# Patient Record
Sex: Male | Born: 1960 | Race: White | Marital: Married | Smoking: Former smoker
Health system: Southern US, Community
[De-identification: ages and names within clinical notes are randomized; demographics above are authoritative.]

## PROBLEM LIST (undated history)

## (undated) DIAGNOSIS — I10 Essential (primary) hypertension: Secondary | ICD-10-CM

## (undated) DIAGNOSIS — I2510 Atherosclerotic heart disease of native coronary artery without angina pectoris: Secondary | ICD-10-CM

## (undated) DIAGNOSIS — E119 Type 2 diabetes mellitus without complications: Secondary | ICD-10-CM

## (undated) DIAGNOSIS — E785 Hyperlipidemia, unspecified: Secondary | ICD-10-CM

## (undated) DIAGNOSIS — Z8249 Family history of ischemic heart disease and other diseases of the circulatory system: Secondary | ICD-10-CM

## (undated) DIAGNOSIS — Z95820 Peripheral vascular angioplasty status with implants and grafts: Secondary | ICD-10-CM

## (undated) HISTORY — PX: CERVICAL FUSION: SHX112

## (undated) HISTORY — DX: Family history of ischemic heart disease and other diseases of the circulatory system: Z82.49

---

## 2014-05-09 ENCOUNTER — Encounter: Payer: PRIVATE HEALTH INSURANCE | Attending: Family Medicine | Admitting: *Deleted

## 2014-05-09 DIAGNOSIS — E119 Type 2 diabetes mellitus without complications: Secondary | ICD-10-CM | POA: Diagnosis not present

## 2014-05-09 DIAGNOSIS — Z713 Dietary counseling and surveillance: Secondary | ICD-10-CM | POA: Diagnosis present

## 2014-05-09 NOTE — Patient Instructions (Addendum)
Plan:  Aim for 3 Carb Choices per meal (45 grams) +/- 1 either way  Aim for 0-15 Carbs per snack if hungry  Include protein in moderation with your meals and snacks Consider reading food labels for Total Carbohydrate and Fat Grams of foods Consider  increasing your activity level by walking for 30 minutes daily as tolerated Continue checking BG at alternate times per day as directed by MD  Continue taking medication  as directed by MD  Make a nice plate for a meal and do not go back for seconds Adriana Simas only for the number of people eating the meal Add beans or chick peas to salad Consider [facility] [employer]

## 2014-05-09 NOTE — Progress Notes (Signed)
Appt start time: 1630 end time:  1800.  Assessment:  Patient was seen on  05/09/14 for individual diabetes education.[name] [name] was diagnosed with T2DM approximately 25 years ago. He has not received any formal education. [name] [name] and his wife have recently moved from [place] to [place]. He has recently established wit new PCP. Wife has MS and is not working.  Issues: portion control and evening snacking. Has eliminated sugar snacks. Wife does most of the shopping and cooking.  Current HbA1c: 7.5%,      FBS 120-130mg /dl  Preferred Learning Style:   No preference indicated   Learning Readiness:   Not ready  Contemplating  Ready  Change in progress  MEDICATIONS: See List, Metformin, Lantus  DIETARY INTAKE: does not use artificial sweeteners  24-hr recall:  B ( AM):  Instant plain oatmeal , coffee, powder creamer/no sweetener Snk ( AM): none  L ( PM): sandwich, tuna/coldcuts,  Diet Soda, Subway Sub 6", Snk ( PM): none D ( PM): Chicken broiled or grilled,fish,  pasta/orzo  ([place] traditional food), vegetables Snk ( PM): nuts from the container Beverages: Diet soda, coffee, water, unsweet tea  Usual physical activity: None   Intervention:  Nutrition counseling provided.  Discussed diabetes disease process and treatment options.  Discussed physiology of diabetes and role of obesity on insulin resistance.  Encouraged moderate weight reduction to improve glucose levels.  Discussed role of medications and diet in glucose control  Provided education on macronutrients on glucose levels.  Provided education on carb counting, importance of regularly scheduled meals/snacks, and meal planning  Discussed effects of physical activity on glucose levels and long-term glucose control.  Recommended 150 minutes of physical activity/week.  Reviewed patient medications.  Discussed role of medication on blood glucose and possible side effects  Discussed blood glucose monitoring and interpretation.   Discussed recommended target ranges and individual ranges.    Described short-term complications: hyper- and hypo-glycemia.  Discussed causes,symptoms, and treatment options.  Discussed prevention, detection, and treatment of long-term complications.  Discussed the role of prolonged elevated glucose levels on body systems.  Discussed role of stress on blood glucose levels and discussed strategies to manage psychosocial issues.  Discussed recommendations for long-term diabetes self-care.  Established checklist for medical, dental, and emotional self-care.  Plan:  Aim for 3 Carb Choices per meal (45 grams) +/- 1 either way  Aim for 0-15 Carbs per snack if hungry  Include protein in moderation with your meals and snacks Consider reading food labels for Total Carbohydrate and Fat Grams of foods Consider  increasing your activity level by walking for 30 minutes daily as tolerated Continue checking BG at alternate times per day as directed by MD  Continue taking medication  as directed by MD  Make a nice plate for a meal and do not go back for seconds Cook only for the number of people eating the meal Add beans or chick peas to salad Consider [facility] Protein Bars  Teaching Method Utilized:  Scientific laboratory technician Hands on  Handouts given during visit include: Carb Counting and Food Label handouts Meal Plan Card My Plate Snack sheet  Barriers to learning/adherence to lifestyle change: committment  Diabetes self-care support plan:   [facility] support group  Wife  Demonstrated degree of understanding via:  Teach Back   Monitoring/Evaluation:  Dietary intake, exercise, test glucose, and body weight , f/u  prn.

## 2014-11-23 ENCOUNTER — Inpatient Hospital Stay (HOSPITAL_COMMUNITY)
Admission: EM | Admit: 2014-11-23 | Discharge: 2014-11-27 | DRG: 247 | Disposition: A | Discharge status: Home / Self Care | Payer: PRIVATE HEALTH INSURANCE | Attending: Cardiovascular Disease | Admitting: Cardiovascular Disease

## 2014-11-23 ENCOUNTER — Encounter (HOSPITAL_COMMUNITY): Payer: Self-pay

## 2014-11-23 ENCOUNTER — Emergency Department (HOSPITAL_COMMUNITY): Payer: PRIVATE HEALTH INSURANCE

## 2014-11-23 DIAGNOSIS — Z87891 Personal history of nicotine dependence: Secondary | ICD-10-CM | POA: Diagnosis not present

## 2014-11-23 DIAGNOSIS — E785 Hyperlipidemia, unspecified: Secondary | ICD-10-CM | POA: Diagnosis present

## 2014-11-23 DIAGNOSIS — E663 Overweight: Secondary | ICD-10-CM | POA: Diagnosis present

## 2014-11-23 DIAGNOSIS — E119 Type 2 diabetes mellitus without complications: Secondary | ICD-10-CM | POA: Diagnosis present

## 2014-11-23 DIAGNOSIS — I10 Essential (primary) hypertension: Secondary | ICD-10-CM | POA: Diagnosis present

## 2014-11-23 DIAGNOSIS — R079 Chest pain, unspecified: Secondary | ICD-10-CM | POA: Insufficient documentation

## 2014-11-23 DIAGNOSIS — M4322 Fusion of spine, cervical region: Secondary | ICD-10-CM | POA: Diagnosis present

## 2014-11-23 DIAGNOSIS — I2510 Atherosclerotic heart disease of native coronary artery without angina pectoris: Secondary | ICD-10-CM | POA: Diagnosis present

## 2014-11-23 DIAGNOSIS — Z79899 Other long term (current) drug therapy: Secondary | ICD-10-CM | POA: Diagnosis not present

## 2014-11-23 DIAGNOSIS — I209 Angina pectoris, unspecified: Secondary | ICD-10-CM

## 2014-11-23 DIAGNOSIS — I214 Non-ST elevation (NSTEMI) myocardial infarction: Principal | ICD-10-CM | POA: Diagnosis present

## 2014-11-23 DIAGNOSIS — Z794 Long term (current) use of insulin: Secondary | ICD-10-CM | POA: Diagnosis not present

## 2014-11-23 DIAGNOSIS — Z9861 Coronary angioplasty status: Secondary | ICD-10-CM

## 2014-11-23 DIAGNOSIS — I252 Old myocardial infarction: Secondary | ICD-10-CM | POA: Diagnosis present

## 2014-11-23 HISTORY — DX: Hyperlipidemia, unspecified: E78.5

## 2014-11-23 HISTORY — DX: Atherosclerotic heart disease of native coronary artery without angina pectoris: I25.10

## 2014-11-23 HISTORY — DX: Essential (primary) hypertension: I10

## 2014-11-23 HISTORY — DX: Peripheral vascular angioplasty status with implants and grafts: Z95.820

## 2014-11-23 HISTORY — DX: Type 2 diabetes mellitus without complications: E11.9

## 2014-11-23 LAB — CBC
HCT: 40.5 % (ref 39.0–52.0)
Hemoglobin: 14.5 g/dL (ref 13.0–17.0)
MCH: 30.7 pg (ref 26.0–34.0)
MCHC: 35.8 g/dL (ref 30.0–36.0)
MCV: 85.6 fL (ref 78.0–100.0)
PLATELETS: 174 10*3/uL (ref 150–400)
RBC: 4.73 MIL/uL (ref 4.22–5.81)
RDW: 12.8 % (ref 11.5–15.5)
WBC: 12 10*3/uL — ABNORMAL HIGH (ref 4.0–10.5)

## 2014-11-23 LAB — PROTIME-INR
INR: 1.11 (ref 0.00–1.49)
PROTHROMBIN TIME: 14.4 s (ref 11.6–15.2)

## 2014-11-23 LAB — COMPREHENSIVE METABOLIC PANEL
ALBUMIN: 3.9 g/dL (ref 3.5–5.2)
ALT: 26 U/L (ref 0–53)
ALT: 25 U/L (ref 0–53)
AST: 41 U/L — AB (ref 0–37)
AST: 35 U/L (ref 0–37)
Albumin: 3.8 g/dL (ref 3.5–5.2)
Alkaline Phosphatase: 78 U/L (ref 39–117)
Alkaline Phosphatase: 78 U/L (ref 39–117)
Anion gap: 14 (ref 5–15)
Anion gap: 14 (ref 5–15)
BILIRUBIN TOTAL: 0.4 mg/dL (ref 0.3–1.2)
BUN: 11 mg/dL (ref 6–23)
BUN: 11 mg/dL (ref 6–23)
CHLORIDE: 100 meq/L (ref 96–112)
CO2: 24 mEq/L (ref 19–32)
CO2: 25 meq/L (ref 19–32)
Calcium: 9.4 mg/dL (ref 8.4–10.5)
Calcium: 9.2 mg/dL (ref 8.4–10.5)
Chloride: 101 mEq/L (ref 96–112)
Creatinine, Ser: 0.58 mg/dL (ref 0.50–1.35)
Creatinine, Ser: 0.64 mg/dL (ref 0.50–1.35)
GFR calc Af Amer: >90 mL/min (ref >90)
GFR calc Af Amer: >90 mL/min (ref >90)
GFR calc non Af Amer: >90 mL/min (ref >90)
Glucose, Bld: 120 mg/dL — ABNORMAL HIGH (ref 70–99)
Glucose, Bld: 110 mg/dL — ABNORMAL HIGH (ref 70–99)
Potassium: 3.9 mEq/L (ref 3.7–5.3)
Potassium: 3.5 mEq/L — ABNORMAL LOW (ref 3.7–5.3)
SODIUM: 139 meq/L (ref 137–147)
Sodium: 139 mEq/L (ref 137–147)
Total Bilirubin: 0.4 mg/dL (ref 0.3–1.2)
Total Protein: 7 g/dL (ref 6.0–8.3)
Total Protein: 6.9 g/dL (ref 6.0–8.3)

## 2014-11-23 LAB — I-STAT CHEM 8, ED
BUN: 11 mg/dL (ref 6–23)
CREATININE: 0.7 mg/dL (ref 0.50–1.35)
Calcium, Ion: 1.16 mmol/L (ref 1.12–1.23)
Chloride: 103 mEq/L (ref 96–112)
Glucose, Bld: 128 mg/dL — ABNORMAL HIGH (ref 70–99)
HCT: 42 % (ref 39.0–52.0)
Hemoglobin: 14.3 g/dL (ref 13.0–17.0)
Potassium: 3.6 mEq/L — ABNORMAL LOW (ref 3.7–5.3)
Sodium: 138 mEq/L (ref 137–147)
TCO2: 24 mmol/L (ref 0–100)

## 2014-11-23 LAB — MAGNESIUM
Magnesium: 1.9 mg/dL (ref 1.5–2.5)
Magnesium: 1.9 mg/dL (ref 1.5–2.5)

## 2014-11-23 LAB — GLUCOSE, CAPILLARY: GLUCOSE-CAPILLARY: 106 mg/dL — AB (ref 70–99)

## 2014-11-23 LAB — TROPONIN I
Troponin I: 6.36 ng/mL (ref <0.30)
Troponin I: 10.56 ng/mL (ref <0.30)

## 2014-11-23 LAB — PRO B NATRIURETIC PEPTIDE: Pro B Natriuretic peptide (BNP): 391.4 pg/mL — ABNORMAL HIGH (ref 0–125)

## 2014-11-23 LAB — APTT: aPTT: 29 seconds (ref 24–37)

## 2014-11-23 LAB — MRSA PCR SCREENING: MRSA by PCR: NEGATIVE

## 2014-11-23 LAB — TSH: TSH: 2.4 u[IU]/mL (ref 0.350–4.500)

## 2014-11-23 LAB — I-STAT TROPONIN, ED: TROPONIN I, POC: 4.18 ng/mL — AB (ref 0.00–0.08)

## 2014-11-23 MED ORDER — ACETAMINOPHEN 325 MG PO TABS
650.0000 mg | ORAL_TABLET | ORAL | Status: DC | PRN
  Administered 2014-11-23: 650 mg via ORAL
  Filled 2014-11-23: qty 2

## 2014-11-23 MED ORDER — NITROGLYCERIN 2 % TD OINT
1.0000 [in_us] | TOPICAL_OINTMENT | Freq: Once | TRANSDERMAL | Status: AC
  Administered 2014-11-23: 1 [in_us] via TOPICAL
  Filled 2014-11-23: qty 1

## 2014-11-23 MED ORDER — ONDANSETRON HCL 4 MG/2ML IJ SOLN: 4.0000 mg | Freq: Four times a day (QID) | INTRAMUSCULAR | Status: DC | PRN

## 2014-11-23 MED ORDER — INSULIN ASPART 100 UNIT/ML ~~LOC~~ SOLN
0.0000 [IU] | Freq: Three times a day (TID) | SUBCUTANEOUS | Status: DC
  Administered 2014-11-24: 3 [IU] via SUBCUTANEOUS
  Administered 2014-11-24: 1 [IU] via SUBCUTANEOUS
  Administered 2014-11-25 – 2014-11-26 (×4): 2 [IU] via SUBCUTANEOUS
  Administered 2014-11-26: 18:00:00 1 [IU] via SUBCUTANEOUS
  Administered 2014-11-27: 2 [IU] via SUBCUTANEOUS

## 2014-11-23 MED ORDER — ATORVASTATIN CALCIUM 80 MG PO TABS
80.0000 mg | ORAL_TABLET | Freq: Every day | ORAL | Status: DC
  Administered 2014-11-24 – 2014-11-26 (×3): 80 mg via ORAL
  Filled 2014-11-23 (×4): qty 1

## 2014-11-23 MED ORDER — INSULIN GLARGINE 100 UNIT/ML ~~LOC~~ SOLN: 45.0000 [IU] | Freq: Every morning | SUBCUTANEOUS | Status: DC

## 2014-11-23 MED ORDER — METOPROLOL SUCCINATE ER 25 MG PO TB24: 25.0000 mg | ORAL_TABLET | Freq: Every day | ORAL | Status: DC

## 2014-11-23 MED ORDER — METOPROLOL TARTRATE 25 MG PO TABS
25.0000 mg | ORAL_TABLET | Freq: Two times a day (BID) | ORAL | Status: DC
  Administered 2014-11-23 – 2014-11-27 (×8): 25 mg via ORAL
  Filled 2014-11-23 (×11): qty 1

## 2014-11-23 MED ORDER — NITROGLYCERIN IN D5W 200-5 MCG/ML-% IV SOLN
10.0000 ug/min | Freq: Once | INTRAVENOUS | Status: AC
  Administered 2014-11-23: 10 ug/min via INTRAVENOUS
  Filled 2014-11-23: qty 250

## 2014-11-23 MED ORDER — ASPIRIN 81 MG PO CHEW: 324.0000 mg | CHEWABLE_TABLET | ORAL | Status: AC

## 2014-11-23 MED ORDER — NITROGLYCERIN 0.4 MG SL SUBL: 0.4000 mg | SUBLINGUAL_TABLET | SUBLINGUAL | Status: DC | PRN

## 2014-11-23 MED ORDER — PRAVASTATIN SODIUM 20 MG PO TABS: 20.0000 mg | ORAL_TABLET | Freq: Every day | ORAL | Status: DC

## 2014-11-23 MED ORDER — IRBESARTAN 150 MG PO TABS: 150.0000 mg | ORAL_TABLET | Freq: Every day | ORAL | Status: DC

## 2014-11-23 MED ORDER — PIOGLITAZONE HCL 45 MG PO TABS: 45.0000 mg | ORAL_TABLET | Freq: Every day | ORAL | Status: DC

## 2014-11-23 MED ORDER — PANTOPRAZOLE SODIUM 40 MG PO TBEC
40.0000 mg | DELAYED_RELEASE_TABLET | Freq: Every day | ORAL | Status: DC
  Administered 2014-11-23 – 2014-11-27 (×5): 40 mg via ORAL
  Filled 2014-11-23 (×5): qty 1

## 2014-11-23 MED ORDER — ASPIRIN 300 MG RE SUPP: 300.0000 mg | RECTAL | Status: AC

## 2014-11-23 MED ORDER — MORPHINE SULFATE 2 MG/ML IJ SOLN
2.0000 mg | Freq: Once | INTRAMUSCULAR | Status: AC
  Administered 2014-11-23: 2 mg via INTRAVENOUS
  Filled 2014-11-23: qty 1

## 2014-11-23 MED ORDER — HEPARIN (PORCINE) IN NACL 100-0.45 UNIT/ML-% IJ SOLN
1500.0000 [IU]/h | INTRAMUSCULAR | Status: DC
  Administered 2014-11-23: 1400 [IU]/h via INTRAVENOUS
  Administered 2014-11-24 – 2014-11-25 (×2): 1500 [IU]/h via INTRAVENOUS
  Filled 2014-11-23 (×9): qty 250

## 2014-11-23 MED ORDER — HEPARIN SODIUM (PORCINE) 5000 UNIT/ML IJ SOLN: 60.0000 [IU]/kg | Freq: Once | INTRAMUSCULAR | Status: DC

## 2014-11-23 MED ORDER — HEPARIN BOLUS VIA INFUSION
4000.0000 [IU] | Freq: Once | INTRAVENOUS | Status: AC
  Administered 2014-11-23: 4000 [IU] via INTRAVENOUS
  Filled 2014-11-23: qty 4000

## 2014-11-23 MED ORDER — NITROGLYCERIN IN D5W 200-5 MCG/ML-% IV SOLN
3.0000 ug/min | INTRAVENOUS | Status: DC
  Administered 2014-11-23: 10 ug/min via INTRAVENOUS

## 2014-11-23 MED ORDER — ASPIRIN EC 81 MG PO TBEC
81.0000 mg | DELAYED_RELEASE_TABLET | Freq: Every day | ORAL | Status: DC
  Administered 2014-11-24 – 2014-11-25 (×2): 81 mg via ORAL
  Filled 2014-11-23 (×2): qty 1

## 2014-11-23 NOTE — ED Notes (Signed)
Per [facility]: pt. Began to gradually develop chest pain last evening after eating dinner. Sharp, shooting pain to substernal area. Pt. Denies SOB, N/V, radiation. Pt. States that sitting up helped with CP. Pt. Was prescribed a BB 12/2. Pt. Given 324 ASA, 2 Nitro with pain improving to 0/10.

## 2014-11-23 NOTE — ED Notes (Signed)
i-Stat trop. result given to Dr. Radford Pax

## 2014-11-23 NOTE — H&P (Signed)
HPI: Mr. Nellessen is a 53 year old moderately overweight. Caucasian male father of one son who goes to [employer] who works in [employer]. His primary care physician is Dr. Silvestre Moment [facility]. He has a history of hypertension, diabetes, hyperlipidemia. He also has a strong family history of heart disease with father that had an MI at age 80 and a brother who died at age 51 with a history of coronary artery disease. He's had chest pain over the last several weeks and was seen by his primary care physician on 11/14/14 with PPIs. At that time his EKG showed no acute changes. He had fairly constant chest pain most the night last night and came to the emergency room today. His troponin was greater than 6 and his EKG shows inferolateral T-wave inversion. He is being admitted for a non-STEMI. He is being already been treated with aspirin and is on IV heparin. He has nitro paste topically. He continues to have 1/10 chest pain.  Problem List: There are no active problems to display for this patient.   PMHx:  Past Medical History  Diagnosis Date  . Diabetes mellitus without complication     type 2  . Hypertension   . Hyperlipidemia    Past Surgical History  Procedure Laterality Date  . Cervical fusion      FAMHx: No family history on file.  SOCHx:  reports that he has quit smoking. He does not have any smokeless tobacco history on file. He reports that he drinks alcohol. He reports that he does not use illicit drugs.  ALLERGIES: No Known Allergies  ROS: Pertinent items are noted in HPI.  HOME MEDICATIONS:  (Not in a hospital admission)  HOSPITAL MEDICATIONS: I have reviewed the patient's current medications.  VITALS: Blood pressure 137/81, pulse 87, temperature 98.5 F (36.9 C), temperature source Oral, resp. rate 21, SpO2 97 %.  INPUT/OUTPUT        PHYSICAL EXAM: General appearance: alert and no distress Neck: no adenopathy, no carotid bruit,  no JVD, supple, symmetrical, trachea midline and thyroid not enlarged, symmetric, no tenderness/mass/nodules Lungs: clear to auscultation bilaterally Heart: regular rate and rhythm, S1, S2 normal, no murmur, click, rub or gallop Extremities: extremities normal, atraumatic, no cyanosis or edema and 2+ pedal pulses  LABS:  BMP  Recent Labs  11/23/14 1706 11/23/14 1731  NA 138 139  K 3.6* 3.9  CL 103 101  CO2  --  24  GLUCOSE 128* 120*  BUN 11 11  CREATININE 0.70 0.58  CALCIUM  --  9.4  GFRNONAA  --  >90  GFRAA  --  >90    CBC  Recent Labs Lab 11/23/14 1731  WBC 12.0*  RBC 4.73  HGB 14.5  HCT 40.5  PLT 174  MCV 85.6    HEMOGLOBIN A1C No results found for: HGBA1C, MPG  Cardiac Panel (last 3 results)  Recent Labs  11/23/14 1731  TROPONINI 6.36*    BNP (last 3 results) No results for input(s): PROBNP in the last 8760 hours.  TSH No results for input(s): TSH in the last 8760 hours.  CHOLESTEROL No results for input(s): CHOL in the last 8760 hours.  Hepatic Function Panel  Recent Labs  11/23/14 1731  PROT 7.0  ALBUMIN 3.9  AST 41*  ALT 26  ALKPHOS 78  BILITOT 0.4    IMAGING: Dg Chest Portable 1 View  11/23/2014   CLINICAL DATA:  One-day history of chest  pain.  EXAM: PORTABLE CHEST - 1 VIEW  COMPARISON:  None.  FINDINGS: The cardiac silhouette, mediastinal and hilar contours are within normal limits given the AP projection and portable technique. The lungs are clear. No pleural effusion. The bony thorax is intact.  IMPRESSION: No acute cardiopulmonary findings.   Electronically Signed   By: Loralie ChampagneMark  Gallerani M.D.   On: 11/23/2014 16:53    IMPRESSION: 1. Non-STEMI-the patient has low-grade residual chest pain. His troponins are positive and he is on IV heparin. He has inferolateral T-wave inversion on his 12-lead EKG. 2. Hypertension: Controlled on current medications 3. Diabetes: On every morning insulin and oral hypoglycemics 4. Hyperlipidemia:  On statin therapy   RECOMMENDATION: 1. Admit  to stepdown. Start IV nitroglycerin as well as beta blocker. Continue IV heparin. Cycle enzymes. The patient will need coronary angiography on Monday unless he becomes unstable over the weekend 2. 2-D echocardiogram for LV function 3. Cycle enzymes  Time Spent Directly with Patient: 30 minutes  BERRY,JONATHAN J 11/23/2014, 6:49 PM

## 2014-11-23 NOTE — ED Provider Notes (Signed)
CSN: [id number]     Arrival date & time 11/23/14  1601 History   First MD Initiated Contact with Patient 11/23/14 1618     Chief Complaint  Patient presents with  . Chest Pain      HPI Per [facility]: pt. Began to gradually develop chest pain last evening after eating dinner. Sharp, shooting pain to substernal area. Pt. Denies SOB, N/V, radiation. Pt. States that sitting up helped with CP. Pt. Was prescribed a BB 12/2. Pt. Given 324 ASA, 2 Nitro with pain improving to 0/10. Past Medical History  Diagnosis Date  . Diabetes mellitus without complication     type 2  . Hypertension   . Hyperlipidemia    Past Surgical History  Procedure Laterality Date  . Cervical fusion     No family history on file. History  Substance Use Topics  . Smoking status: Former Games developer  . Smokeless tobacco: Not on file  . Alcohol Use: Yes     Comment: occasional    Review of Systems  All other systems reviewed and are negative  Allergies  Review of patient's allergies indicates no known allergies.  Home Medications   Prior to Admission medications   Medication Sig Start Date End Date Taking? Authorizing Provider  esomeprazole (NEXIUM) 20 MG capsule Take 20 mg by mouth 2 (two) times daily.    Historical Provider, MD  insulin glargine (LANTUS) 100 UNIT/ML injection Inject 45 Units into the skin every morning.    Historical Provider, MD  metFORMIN (GLUCOPHAGE) 1000 MG tablet Take 1,000 mg by mouth 2 (two) times daily with a meal.    Historical Provider, MD  metoprolol succinate (TOPROL-XL) 25 MG 24 hr tablet Take 25 mg by mouth daily.    Historical Provider, MD  pioglitazone (ACTOS) 45 MG tablet Take 45 mg by mouth daily.    Historical Provider, MD  pravastatin (PRAVACHOL) 20 MG tablet Take 20 mg by mouth daily.    Historical Provider, MD  valsartan (DIOVAN) 160 MG tablet Take 160 mg by mouth daily.    Historical Provider, MD   BP 148/86 mmHg  Pulse 88  Temp(Src) 98.2 F (36.8 C) (Oral)   Resp 15  Ht 6' (1.829 m)  Wt 264 lb 12.4 oz (120.1 kg)  BMI 35.90 kg/m2  SpO2 96% Physical Exam Physical Exam  Nursing note and vitals reviewed. Constitutional: He is oriented to person, place, and time. He appears well-developed and well-nourished. No distress.  HENT:  Head: Normocephalic and atraumatic.  Eyes: Pupils are equal, round, and reactive to light.  Neck: Normal range of motion.  Cardiovascular: Normal rate and intact distal pulses.   Pulmonary/Chest: No respiratory distress.  Abdominal: Normal appearance. He exhibits no distension.  Musculoskeletal: Normal range of motion.  Neurological: He is alert and oriented to person, place, and time. No cranial nerve deficit.  Skin: Skin is warm and dry. No rash noted.  Psychiatric: He has a normal mood and affect. His behavior is normal.   ED Course  Procedures (including critical care time) Medications  heparin ADULT infusion 100 units/mL (25000 units/250 mL) (1,500 Units/hr Intravenous New Bag/Given 11/24/14 0800)  pantoprazole (PROTONIX) EC tablet 40 mg (40 mg Oral Given 11/24/14 1035)  aspirin chewable tablet 324 mg (324 mg Oral Not Given 11/23/14 1915)    Or  aspirin suppository 300 mg ( Rectal See Alternative 11/23/14 1915)  aspirin EC tablet 81 mg (81 mg Oral Given 11/24/14 1035)  nitroGLYCERIN (NITROSTAT) SL tablet 0.4 mg (  not administered)  acetaminophen (TYLENOL) tablet 650 mg (650 mg Oral Given 11/23/14 2044)  ondansetron (ZOFRAN) injection 4 mg (not administered)  nitroGLYCERIN 50 mg in dextrose 5 % 250 mL (0.2 mg/mL) infusion (10 mcg/min Intravenous New Bag/Given 11/23/14 2037)  metoprolol tartrate (LOPRESSOR) tablet 25 mg (25 mg Oral Given 11/24/14 1035)  atorvastatin (LIPITOR) tablet 80 mg (not administered)  insulin aspart (novoLOG) injection 0-9 Units (0 Units Subcutaneous Not Given 11/24/14 0800)  nitroGLYCERIN (NITROGLYN) 2 % ointment 1 inch (1 inch Topical Given 11/23/14 1735)  morphine 2 MG/ML injection 2  mg (2 mg Intravenous Given 11/23/14 1737)  heparin bolus via infusion 4,000 Units (0 Units Intravenous Stopped 11/23/14 1836)  nitroGLYCERIN 50 mg in dextrose 5 % 250 mL (0.2 mg/mL) infusion (10 mcg/min Intravenous New Bag/Given 11/23/14 1852)    CRITICAL CARE Performed by: Nelva NayBEATON,Makayia Duplessis L Total critical care time: 30 min Critical care time was exclusive of separately billable procedures and treating other patients. Critical care was necessary to treat or prevent imminent or life-threatening deterioration. Critical care was time spent personally by me on the following activities: development of treatment plan with patient and/or surrogate as well as nursing, discussions with consultants, evaluation of patient's response to treatment, examination of patient, obtaining history from patient or surrogate, ordering and performing treatments and interventions, ordering and review of laboratory studies, ordering and review of radiographic studies, pulse oximetry and re-evaluation of patient's condition.  Labs Review Labs Reviewed  CBC - Abnormal; Notable for the following:    WBC 12.0 (*)    All other components within normal limits  COMPREHENSIVE METABOLIC PANEL - Abnormal; Notable for the following:    Glucose, Bld 120 (*)    AST 41 (*)    All other components within normal limits  TROPONIN I - Abnormal; Notable for the following:    Troponin I 6.36 (*)    All other components within normal limits  HEPARIN LEVEL (UNFRACTIONATED) - Abnormal; Notable for the following:    Heparin Unfractionated 0.29 (*)    All other components within normal limits  CBC - Abnormal; Notable for the following:    WBC 10.9 (*)    All other components within normal limits  COMPREHENSIVE METABOLIC PANEL - Abnormal; Notable for the following:    Potassium 3.5 (*)    Glucose, Bld 110 (*)    All other components within normal limits  BASIC METABOLIC PANEL - Abnormal; Notable for the following:    Glucose, Bld 113  (*)    All other components within normal limits  LIPID PANEL - Abnormal; Notable for the following:    HDL 34 (*)    All other components within normal limits  PRO B NATRIURETIC PEPTIDE - Abnormal; Notable for the following:    Pro B Natriuretic peptide (BNP) 391.4 (*)    All other components within normal limits  TROPONIN I - Abnormal; Notable for the following:    Troponin I 10.56 (*)    All other components within normal limits  TROPONIN I - Abnormal; Notable for the following:    Troponin I 5.92 (*)    All other components within normal limits  TROPONIN I - Abnormal; Notable for the following:    Troponin I 7.99 (*)    All other components within normal limits  GLUCOSE, CAPILLARY - Abnormal; Notable for the following:    Glucose-Capillary 106 (*)    All other components within normal limits  GLUCOSE, CAPILLARY - Abnormal; Notable for the following:  Glucose-Capillary 105 (*)    All other components within normal limits  I-STAT TROPOININ, ED - Abnormal; Notable for the following:    Troponin i, poc 4.18 (*)    All other components within normal limits  I-STAT CHEM 8, ED - Abnormal; Notable for the following:    Potassium 3.6 (*)    Glucose, Bld 128 (*)    All other components within normal limits  MRSA PCR SCREENING  APTT  MAGNESIUM  PROTIME-INR  MAGNESIUM  TSH  HEPARIN LEVEL (UNFRACTIONATED)  T4, FREE  HEMOGLOBIN A1C    Imaging Review Dg Chest Portable 1 View  11/23/2014   CLINICAL DATA:  One-day history of chest pain.  EXAM: PORTABLE CHEST - 1 VIEW  COMPARISON:  None.  FINDINGS: The cardiac silhouette, mediastinal and hilar contours are within normal limits given the AP projection and portable technique. The lungs are clear. No pleural effusion. The bony thorax is intact.  IMPRESSION: No acute cardiopulmonary findings.   Electronically Signed   By: Loralie Champagne M.D.   On: 11/23/2014 16:53     EKG Interpretation   Date/Time:  Friday November 23 2014  16:08:10 EST Ventricular Rate:  78 PR Interval:  140 QRS Duration: 92 QT Interval:  378 QTC Calculation: 430 R Axis:   32 Text Interpretation:  Sinus rhythm Abnormal T, consider ischemia, diffuse  leads Abnormal ekg Confirmed by Alois Colgan  MD, Damyen Knoll ([id number]) on 11/23/2014  4:16:52 PM     Cardiology conatcted and came and admitted to their service. MDM   Final diagnoses:  Chest pain  NSTEMI (non-ST elevated myocardial infarction)        Nelia Shi, MD 11/24/14 1123

## 2014-11-23 NOTE — Progress Notes (Signed)
ANTICOAGULATION CONSULT NOTE - Initial Consult  Pharmacy Consult for heparin Indication: chest pain/ACS  No Known Allergies  Patient Measurements: weight 120 kg, height 72 inches (per patient)    Heparin Dosing Weight: 104 kg  Vital Signs: Temp: 98.5 F (36.9 C) (12/11 1612) Temp Source: Oral (12/11 1612) BP: 130/78 mmHg (12/11 1630) Pulse Rate: 75 (12/11 1630)  Labs:  Recent Labs  11/23/14 1706  HGB 14.3  HCT 42.0  CREATININE 0.70    CrCl cannot be calculated (Unknown ideal weight.).   Medical History: Past Medical History  Diagnosis Date  . Diabetes mellitus without complication     type 2  . Hypertension   . Hyperlipidemia     Medications:  See med rec   Assessment: Patient is a 53 y.o M presented to the ED with c/o CP and found to have positive troponin.  To start heparin for ACS.  He denies hx of recent bleeding events or on anticoagulants PTA.   Goal of Therapy:  Heparin level 0.3-0.7 units/ml Monitor platelets by anticoagulation protocol: Yes   Plan:  1) heparin 4000 units IV bolus x1, then 1400 units/hr 2) check 6 hor heparin level  [name] [name] P 11/23/2014,5:36 PM

## 2014-11-23 NOTE — ED Notes (Signed)
Dr Berry at bedside.   

## 2014-11-24 DIAGNOSIS — I517 Cardiomegaly: Secondary | ICD-10-CM

## 2014-11-24 LAB — CBC
HCT: 39.3 % (ref 39.0–52.0)
Hemoglobin: 13.4 g/dL (ref 13.0–17.0)
MCH: 29.5 pg (ref 26.0–34.0)
MCHC: 34.1 g/dL (ref 30.0–36.0)
MCV: 86.4 fL (ref 78.0–100.0)
Platelets: 184 10*3/uL (ref 150–400)
RBC: 4.55 MIL/uL (ref 4.22–5.81)
RDW: 12.9 % (ref 11.5–15.5)
WBC: 10.9 10*3/uL — ABNORMAL HIGH (ref 4.0–10.5)

## 2014-11-24 LAB — TROPONIN I
TROPONIN I: 4.27 ng/mL — AB (ref <0.30)
Troponin I: 5.92 ng/mL (ref <0.30)
Troponin I: 7.99 ng/mL (ref <0.30)

## 2014-11-24 LAB — BASIC METABOLIC PANEL
Anion gap: 14 (ref 5–15)
BUN: 11 mg/dL (ref 6–23)
CHLORIDE: 103 meq/L (ref 96–112)
CO2: 23 mEq/L (ref 19–32)
Calcium: 9.2 mg/dL (ref 8.4–10.5)
Creatinine, Ser: 0.64 mg/dL (ref 0.50–1.35)
GFR calc Af Amer: >90 mL/min (ref >90)
GFR calc non Af Amer: >90 mL/min (ref >90)
GLUCOSE: 113 mg/dL — AB (ref 70–99)
POTASSIUM: 3.8 meq/L (ref 3.7–5.3)
Sodium: 140 mEq/L (ref 137–147)

## 2014-11-24 LAB — HEPARIN LEVEL (UNFRACTIONATED)
HEPARIN UNFRACTIONATED: 0.29 [IU]/mL — AB (ref 0.30–0.70)
HEPARIN UNFRACTIONATED: 0.47 [IU]/mL (ref 0.30–0.70)
Heparin Unfractionated: 0.38 IU/mL (ref 0.30–0.70)

## 2014-11-24 LAB — HEMOGLOBIN A1C
Hgb A1c MFr Bld: 8.3 % — ABNORMAL HIGH (ref <5.7)
MEAN PLASMA GLUCOSE: 192 mg/dL — AB (ref <117)

## 2014-11-24 LAB — GLUCOSE, CAPILLARY
GLUCOSE-CAPILLARY: 105 mg/dL — AB (ref 70–99)
GLUCOSE-CAPILLARY: 203 mg/dL — AB (ref 70–99)
Glucose-Capillary: 130 mg/dL — ABNORMAL HIGH (ref 70–99)
Glucose-Capillary: 164 mg/dL — ABNORMAL HIGH (ref 70–99)

## 2014-11-24 LAB — LIPID PANEL
CHOLESTEROL: 137 mg/dL (ref 0–200)
HDL: 34 mg/dL — ABNORMAL LOW (ref >39)
LDL CALC: 86 mg/dL (ref 0–99)
TRIGLYCERIDES: 84 mg/dL (ref <150)
Total CHOL/HDL Ratio: 4 RATIO
VLDL: 17 mg/dL (ref 0–40)

## 2014-11-24 LAB — T4, FREE: Free T4: 1.22 ng/dL (ref 0.80–1.80)

## 2014-11-24 NOTE — Progress Notes (Signed)
ANTICOAGULATION CONSULT NOTE - Follow Up Consult  Pharmacy Consult for Heparin  Indication: chest pain/ACS  No Known Allergies  Labs:  Recent Labs  11/23/14 1706  11/23/14 1731 11/23/14 2141  11/24/14 0116 11/24/14 0640 11/24/14 0930 11/24/14 1530 11/24/14 1905  HGB 14.3  --  14.5  --   --  13.4  --   --   --   --   HCT 42.0  --  40.5  --   --  39.3  --   --   --   --   PLT  --   --  174  --   --  184  --   --   --   --   APTT  --   --  29  --   --   --   --   --   --   --   LABPROT  --   --  14.4  --   --   --   --   --   --   --   INR  --   --  1.11  --   --   --   --   --   --   --   HEPARINUNFRC  --   --   --   --   < > 0.29*  --  0.38 >2.20* 0.47  CREATININE 0.70  --  0.58 0.64  --  0.64  --   --   --   --   TROPONINI  --   < > 6.36* 10.56*  --  5.92* 7.99*  --  4.27*  --   < > = values in this interval not displayed.  Estimated Creatinine Clearance: 144.5 mL/min (by C-G formula based on Cr of 0.64).  Assessment: Heparin for +troponin/ACS, awaiting cath on Monday.  Heparin level therapeutic x 2   Goal of Therapy:  Heparin level 0.3-0.7 units/ml Monitor platelets by anticoagulation protocol: Yes   Plan:  1. Continue heparin drip at 1500 units/hr 3. Daily CBC/HL 3. Monitor for bleeding and any changes to cath plans  Thank you. Okey Regal, PharmD [phone]  11/24/2014 8:45 PM

## 2014-11-24 NOTE — Progress Notes (Signed)
ANTICOAGULATION CONSULT NOTE - Follow Up Consult  Pharmacy Consult for Heparin  Indication: chest pain/ACS  No Known Allergies  Patient Measurements: Height: 6' (182.9 cm) Weight: 266 lb 1.5 oz (120.7 kg) IBW/kg (Calculated) : 77.6  Vital Signs: Temp: 98 F (36.7 C) (12/12 0000) Temp Source: Oral (12/12 0000) BP: 118/75 mmHg (12/12 0000) Pulse Rate: 71 (12/12 0000)  Labs:  Recent Labs  11/23/14 1706 11/23/14 1731 11/23/14 2141 11/24/14 0116  HGB 14.3 14.5  --  13.4  HCT 42.0 40.5  --  39.3  PLT  --  174  --  184  APTT  --  29  --   --   LABPROT  --  14.4  --   --   INR  --  1.11  --   --   HEPARINUNFRC  --   --   --  0.29*  CREATININE 0.70 0.58 0.64 0.64  TROPONINI  --  6.36* 10.56*  --     Estimated Creatinine Clearance: 144.8 mL/min (by C-G formula based on Cr of 0.64).  Assessment: Heparin for +troponin. First HL is slightly low at 0.29. Other labs as above. No issues per RN.   Goal of Therapy:  Heparin level 0.3-0.7 units/ml Monitor platelets by anticoagulation protocol: Yes   Plan:  -Increase heparin drip to 1500 units/hr -1000 HL -Daily CBC/HL -Monitor for bleeding  Abran Duke 11/24/2014,2:26 AM

## 2014-11-24 NOTE — Progress Notes (Signed)
ANTICOAGULATION CONSULT NOTE - Follow Up Consult  Pharmacy Consult for Heparin  Indication: chest pain/ACS  No Known Allergies  Patient Measurements: Height: 6' (182.9 cm) Weight: 264 lb 12.4 oz (120.1 kg) IBW/kg (Calculated) : 77.6  Heparin dosing weight: 104kg  Vital Signs: Temp: 98 F (36.7 C) (12/12 1214) Temp Source: Oral (12/12 1214) BP: 132/75 mmHg (12/12 1214) Pulse Rate: 88 (12/12 1035)  Labs:  Recent Labs  11/23/14 1706  11/23/14 1731 11/23/14 2141 11/24/14 0116 11/24/14 0640 11/24/14 0930  HGB 14.3  --  14.5  --  13.4  --   --   HCT 42.0  --  40.5  --  39.3  --   --   PLT  --   --  174  --  184  --   --   APTT  --   --  29  --   --   --   --   LABPROT  --   --  14.4  --   --   --   --   INR  --   --  1.11  --   --   --   --   HEPARINUNFRC  --   --   --   --  0.29*  --  0.38  CREATININE 0.70  --  0.58 0.64 0.64  --   --   TROPONINI  --   < > 6.36* 10.56* 5.92* 7.99*  --   < > = values in this interval not displayed.  Estimated Creatinine Clearance: 144.5 mL/min (by C-G formula based on Cr of 0.64).  Assessment: Heparin for +troponin/ACS, awaiting cath on Monday. First HL this morning was slightly low at 0.29 and rate was increased to 1500 units/hr. Now heparin level is therapeutic. Hgb and plts remain stable and no bleeding noted.  Goal of Therapy:  Heparin level 0.3-0.7 units/ml Monitor platelets by anticoagulation protocol: Yes   Plan:  1. Continue heparin drip at 1500 units/hr 2. Confirmatory level at 1600 3. Daily CBC/HL 4. Monitor for bleeding and any changes to cath plans  Susanna Benge D. Glendene Wyer, PharmD, BCPS Clinical Pharmacist Pager: [phone] 11/24/2014 1:41 PM

## 2014-11-24 NOTE — Progress Notes (Signed)
CARDIAC REHAB PHASE I   PRE:  Rate/Rhythm: 87  BP:  Sitting: 141/70     SaO2: 98% RA  MODE:  Ambulation: 700 ft   POST:  Rate/Rhythm: 89  BP:  Sitting: 148/86     SaO2: 97% RA  10:03-10:37pm  Patient tolerated walk well.  He walked independently holding onto the IV pole.  Patient did not complain of shortness of breath or any chest pain. He states that he is interested in Cardiac Rehab at [facility] Patient placed in chair with call bell in reach.    Bland Span, MS 11/24/2014 10:35 AM

## 2014-11-24 NOTE — Progress Notes (Signed)
[facility]  2D Echocardiogram completed.  Arijana Narayan L Mahogany Torrance, RDCS 11/24/2014 9:24 AM

## 2014-11-24 NOTE — Progress Notes (Signed)
Subjective:  Pt presented to ED w/ chest pain over the last several weeks and was seen by his primary care physician on 11/14/14 with PPIs. At that time his EKG showed no acute changes. He had fairly constant chest pain most the night last night and came to the emergency room on 11/24/14. His troponin was greater than 6 and his EKG shows inferolateral T-wave inversion.   Admitted for NSTEMI  Tx initially w/ heparin gtt, ASA, and nitro paste.    Denies CP, SOB, or exertional angina this AM.  Slightly hungry, awaiting cath on Monday.   Problem List: Patient Active Problem List   Diagnosis Date Noted  . Non-STEMI (non-ST elevated myocardial infarction) 11/23/2014  . Insulin dependent diabetes mellitus 11/23/2014  . Hypertension 11/23/2014  . Hyperlipidemia 11/23/2014  . NSTEMI (non-ST elevated myocardial infarction) 11/23/2014  . Chest pain     PMHx:  Past Medical History  Diagnosis Date  . Diabetes mellitus without complication     type 2  . Hypertension   . Hyperlipidemia    Past Surgical History  Procedure Laterality Date  . Cervical fusion      FAMHx: No family history on file.  SOCHx:  reports that he has quit smoking. He does not have any smokeless tobacco history on file. He reports that he drinks alcohol. He reports that he does not use illicit drugs.  ALLERGIES: No Known Allergies  ROS: Pertinent items are noted in HPI.  HOME MEDICATIONS: Prescriptions prior to admission  Medication Sig Dispense Refill Last Dose  . esomeprazole (NEXIUM) 20 MG capsule Take 20 mg by mouth 2 (two) times daily.   More than a month at Unknown time  . insulin glargine (LANTUS) 100 UNIT/ML injection Inject 45 Units into the skin every morning.   More than a month at Unknown time  . metFORMIN (GLUCOPHAGE) 1000 MG tablet Take 1,000 mg by mouth 2 (two) times daily with a meal.   More than a month at Unknown time  . metoprolol succinate (TOPROL-XL) 25 MG 24 hr tablet Take 25  mg by mouth daily.   More than a month at 1900  . pioglitazone (ACTOS) 45 MG tablet Take 45 mg by mouth daily.   More than a month at Unknown time  . pravastatin (PRAVACHOL) 20 MG tablet Take 20 mg by mouth daily.   More than a month at Unknown time  . valsartan (DIOVAN) 160 MG tablet Take 160 mg by mouth daily.   More than a month at Unknown time    HOSPITAL MEDICATIONS: I have reviewed the patient's current medications.  VITALS: Blood pressure 132/75, pulse 88, temperature 98 F (36.7 C), temperature source Oral, resp. rate 18, height 6' (1.829 m), weight 120.1 kg (264 lb 12.4 oz), SpO2 98 %.  INPUT/OUTPUT I/O last 3 completed shifts: In: 194.4 [I.V.:194.4] Out: 400 [Urine:400] Total I/O In: 276 [P.O.:240; I.V.:36] Out: 975 [Urine:975]    PHYSICAL EXAM: General appearance: alert, cooperative and no distress Neck: no JVD Lungs: clear to auscultation bilaterally Heart: regular rate and rhythm, S1, S2 normal, no murmur, click, rub or gallop Extremities: extremities normal, atraumatic, no cyanosis or edema and 2+ pedal pulses  LABS:  BMP  Recent Labs  11/23/14 1731 11/23/14 2141 11/24/14 0116  NA 139 139 140  K 3.9 3.5* 3.8  CL 101 100 103  CO2 24 25 23   GLUCOSE 120* 110* 113*  BUN 11 11 11   CREATININE 0.58 0.64  0.64  CALCIUM 9.4 9.2 9.2  GFRNONAA >90 >90 >90  GFRAA >90 >90 >90    CBC  Recent Labs Lab 11/24/14 0116  WBC 10.9*  RBC 4.55  HGB 13.4  HCT 39.3  PLT 184  MCV 86.4    HEMOGLOBIN A1C No results found for: HGBA1C, MPG  Cardiac Panel (last 3 results)  Recent Labs  11/23/14 2141 11/24/14 0116 11/24/14 0640  TROPONINI 10.56* 5.92* 7.99*    BNP (last 3 results)  Recent Labs  11/23/14 2141  PROBNP 391.4*    TSH  Recent Labs  11/23/14 2141  TSH 2.400    CHOLESTEROL  Recent Labs  11/24/14 0116  CHOL 137    Hepatic Function Panel  Recent Labs  11/23/14 1731 11/23/14 2141  PROT 7.0 6.9  ALBUMIN 3.9 3.8  AST 41*  35  ALT 26 25  ALKPHOS 78 78  BILITOT 0.4 0.4    IMAGING: Dg Chest Portable 1 View  11/23/2014   CLINICAL DATA:  One-day history of chest pain.  EXAM: PORTABLE CHEST - 1 VIEW  COMPARISON:  None.  FINDINGS: The cardiac silhouette, mediastinal and hilar contours are within normal limits given the AP projection and portable technique. The lungs are clear. No pleural effusion. The bony thorax is intact.  IMPRESSION: No acute cardiopulmonary findings.   Electronically Signed   By: Loralie ChampagneMark  Gallerani M.D.   On: 11/23/2014 16:53    Assessment: 1. NSTEMI, troponin trending down 2. Hypertension, controlled 3. DM II, controlled 4. Hyperlipidemia: On statin therapy    Plan: Pt with NSTEMI, awaiting PCI on Monday.  Troponin trending down at this point, continue to cycle.  Continue with BB, heparin gtt, Lipitor 80 mg, Nitro gtt (if angina, otherwise can wean off).  2D Echo pending to evaluate LV fxn.  Can start ACE after cath. 2D Echo pending.   Gildardo CrankerHess, Bryan 11/24/2014, 12:23 PM   Patient seen and examined with Dr. Paulina FusiHess. We discussed all aspects of the encounter. I agree with the assessment and plan as stated above.   Doing well. No further CP. For cath Monday. Continue current regimen. Can got to tele.   Truman Haywardaniel Jamarien Rodkey,MD 2:37 PM

## 2014-11-25 LAB — GLUCOSE, CAPILLARY
GLUCOSE-CAPILLARY: 155 mg/dL — AB (ref 70–99)
Glucose-Capillary: 158 mg/dL — ABNORMAL HIGH (ref 70–99)
Glucose-Capillary: 163 mg/dL — ABNORMAL HIGH (ref 70–99)
Glucose-Capillary: 188 mg/dL — ABNORMAL HIGH (ref 70–99)

## 2014-11-25 LAB — CBC
HEMATOCRIT: 38.4 % — AB (ref 39.0–52.0)
HEMOGLOBIN: 13 g/dL (ref 13.0–17.0)
MCH: 29.4 pg (ref 26.0–34.0)
MCHC: 33.9 g/dL (ref 30.0–36.0)
MCV: 86.9 fL (ref 78.0–100.0)
Platelets: 167 10*3/uL (ref 150–400)
RBC: 4.42 MIL/uL (ref 4.22–5.81)
RDW: 13 % (ref 11.5–15.5)
WBC: 8.9 10*3/uL (ref 4.0–10.5)

## 2014-11-25 LAB — HEPARIN LEVEL (UNFRACTIONATED): HEPARIN UNFRACTIONATED: 0.44 [IU]/mL (ref 0.30–0.70)

## 2014-11-25 MED ORDER — ASPIRIN 81 MG PO CHEW
81.0000 mg | CHEWABLE_TABLET | ORAL | Status: AC
  Administered 2014-11-26: 81 mg via ORAL
  Filled 2014-11-25: qty 1

## 2014-11-25 MED ORDER — SODIUM CHLORIDE 0.9 % IV SOLN: 250.0000 mL | INTRAVENOUS | Status: DC | PRN

## 2014-11-25 MED ORDER — SODIUM CHLORIDE 0.9 % IJ SOLN
3.0000 mL | Freq: Two times a day (BID) | INTRAMUSCULAR | Status: DC
  Administered 2014-11-25: 3 mL via INTRAVENOUS

## 2014-11-25 MED ORDER — SODIUM CHLORIDE 0.9 % IJ SOLN: 3.0000 mL | INTRAMUSCULAR | Status: DC | PRN

## 2014-11-25 MED ORDER — SODIUM CHLORIDE 0.9 % IV SOLN
INTRAVENOUS | Status: DC
  Administered 2014-11-26: 04:00:00 via INTRAVENOUS

## 2014-11-25 MED ORDER — ASPIRIN EC 81 MG PO TBEC: 81.0000 mg | DELAYED_RELEASE_TABLET | Freq: Every day | ORAL | Status: DC

## 2014-11-25 NOTE — Progress Notes (Signed)
Subjective: No CP, SOB, orthopnea  Objective: Vital signs in last 24 hours: Temp:  [98 F (36.7 C)-98.8 F (37.1 C)] 98.8 F (37.1 C) (12/13 0400) Pulse Rate:  [74-85] 75 (12/13 1013) Resp:  [18-21] 21 (12/13 0400) BP: (118-137)/(64-75) 130/68 mmHg (12/13 1013) SpO2:  [96 %-98 %] 96 % (12/13 0400) Weight:  [266 lb 12.8 oz (121.02 kg)] 266 lb 12.8 oz (121.02 kg) (12/13 0400) Last BM Date: 11/24/14  Intake/Output from previous day: 12/12 0701 - 12/13 0700 In: 1416 [P.O.:1200; I.V.:216] Out: 2390 [Urine:2390] Intake/Output this shift: Total I/O In: -  Out: 500 [Urine:500]  Medications Current Facility-Administered Medications  Medication Dose Route Frequency Provider Last Rate Last Dose  . acetaminophen (TYLENOL) tablet 650 mg  650 mg Oral Q4H PRN Runell GessJonathan J Berry, MD   650 mg at 11/23/14 2044  . aspirin EC tablet 81 mg  81 mg Oral Daily Runell GessJonathan J Berry, MD   81 mg at 11/25/14 1014  . atorvastatin (LIPITOR) tablet 80 mg  80 mg Oral q1800 Runell GessJonathan J Berry, MD   80 mg at 11/24/14 1820  . heparin ADULT infusion 100 units/mL (25000 units/250 mL)  1,500 Units/hr Intravenous Continuous Abran DukeJames Ledford, RPH 15 mL/hr at 11/24/14 0800 1,500 Units/hr at 11/24/14 0800  . insulin aspart (novoLOG) injection 0-9 Units  0-9 Units Subcutaneous TID WC Dwana MelenaBryan W Hager, PA-C   2 Units at 11/25/14 [phone]  . metoprolol tartrate (LOPRESSOR) tablet 25 mg  25 mg Oral BID Runell GessJonathan J Berry, MD   25 mg at 11/25/14 1013  . nitroGLYCERIN (NITROSTAT) SL tablet 0.4 mg  0.4 mg Sublingual Q5 Min x 3 PRN Runell GessJonathan J Berry, MD      . ondansetron [facility](ZOFRAN) injection 4 mg  4 mg Intravenous Q6H PRN Runell GessJonathan J Berry, MD      . pantoprazole (PROTONIX) EC tablet 40 mg  40 mg Oral Daily Runell GessJonathan J Berry, MD   40 mg at 11/25/14 1014    PE: General appearance: alert, cooperative and no distress Lungs: clear to auscultation bilaterally Heart: regular rate and rhythm, S1, S2 normal, no murmur, click, rub or  gallop Extremities: No LEE Pulses: 2+ and symmetric Skin: Warm and dry Neurologic: Grossly normal  Lab Results:   Recent Labs  11/23/14 1731 11/24/14 0116 11/25/14 0431  WBC 12.0* 10.9* 8.9  HGB 14.5 13.4 13.0  HCT 40.5 39.3 38.4*  PLT 174 184 167   BMET  Recent Labs  11/23/14 1731 11/23/14 2141 11/24/14 0116  NA 139 139 140  K 3.9 3.5* 3.8  CL 101 100 103  CO2 24 25 23   GLUCOSE 120* 110* 113*  BUN 11 11 11   CREATININE 0.58 0.64 0.64  CALCIUM 9.4 9.2 9.2   PT/INR  Recent Labs  11/23/14 1731  LABPROT 14.4  INR 1.11   Cholesterol  Recent Labs  11/24/14 0116  CHOL 137   Lipid Panel     Component Value Date/Time   CHOL 137 11/24/2014 0116   TRIG 84 11/24/2014 0116   HDL 34* 11/24/2014 0116   CHOLHDL 4.0 11/24/2014 0116   VLDL 17 11/24/2014 0116   LDLCALC 86 11/24/2014 0116    Assessment/Plan  Principal Problem:   Non-STEMI (non-ST elevated myocardial infarction) Active Problems:   Insulin dependent diabetes mellitus   Hypertension   Hyperlipidemia   NSTEMI (non-ST elevated myocardial infarction)  Plan:  Pain free.  Troponin peaked at 10.56.  On ASA, statin, lopressor 25bid, IV heparin.  Left heart  cath tomorrow.  Echo revealed normal EF and wall motion.    LOS: 2 days    HAGER, BRYAN PA-C 11/25/2014 10:35 AM Patient seen and examined. I agree with the assessment and plan as detailed above. See also my additional thoughts below.   The patient is stable and is ready for intervention tomorrow.  Willa Rough, MD, [facility] 11/25/2014 10:51 AM

## 2014-11-25 NOTE — Progress Notes (Signed)
ANTICOAGULATION CONSULT NOTE - Follow Up Consult  Pharmacy Consult for Heparin  Indication: chest pain/ACS  No Known Allergies  Labs:  Recent Labs  11/23/14 1731 11/23/14 2141 11/24/14 0116 11/24/14 0640  11/24/14 1530 11/24/14 1905 11/25/14 0431  HGB 14.5  --  13.4  --   --   --   --  13.0  HCT 40.5  --  39.3  --   --   --   --  38.4*  PLT 174  --  184  --   --   --   --  167  APTT 29  --   --   --   --   --   --   --   LABPROT 14.4  --   --   --   --   --   --   --   INR 1.11  --   --   --   --   --   --   --   HEPARINUNFRC  --   --  0.29*  --   < > >2.20* 0.47 0.44  CREATININE 0.58 0.64 0.64  --   --   --   --   --   TROPONINI 6.36* 10.56* 5.92* 7.99*  --  4.27*  --   --   < > = values in this interval not displayed.  Estimated Creatinine Clearance: 145.1 mL/min (by C-G formula based on Cr of 0.64).  Assessment: Heparin for +troponin/ACS, awaiting cath on Monday.  Heparin level therapeutic   Goal of Therapy:  Heparin level 0.3-0.7 units/ml Monitor platelets by anticoagulation protocol: Yes   Plan:  1. Continue heparin drip at 1500 units/hr 3. Daily CBC/HL 3. Monitor for bleeding and any changes to cath plans  Thank you. Okey Regal, PharmD [phone]  11/25/2014 1:40 PM

## 2014-11-26 ENCOUNTER — Encounter (HOSPITAL_COMMUNITY)
Admission: EM | Disposition: A | Discharge status: Home / Self Care | Payer: PRIVATE HEALTH INSURANCE | Source: Home / Self Care | Attending: Cardiovascular Disease

## 2014-11-26 ENCOUNTER — Other Ambulatory Visit: Payer: Self-pay

## 2014-11-26 DIAGNOSIS — I2510 Atherosclerotic heart disease of native coronary artery without angina pectoris: Secondary | ICD-10-CM

## 2014-11-26 HISTORY — PX: LEFT HEART CATHETERIZATION WITH CORONARY ANGIOGRAM: SHX5451

## 2014-11-26 LAB — CBC
HCT: 40.1 % (ref 39.0–52.0)
Hemoglobin: 13.5 g/dL (ref 13.0–17.0)
MCH: 29.2 pg (ref 26.0–34.0)
MCHC: 33.7 g/dL (ref 30.0–36.0)
MCV: 86.6 fL (ref 78.0–100.0)
PLATELETS: 160 10*3/uL (ref 150–400)
RBC: 4.63 MIL/uL (ref 4.22–5.81)
RDW: 12.9 % (ref 11.5–15.5)
WBC: 8.1 10*3/uL (ref 4.0–10.5)

## 2014-11-26 LAB — GLUCOSE, CAPILLARY
GLUCOSE-CAPILLARY: 131 mg/dL — AB (ref 70–99)
GLUCOSE-CAPILLARY: 183 mg/dL — AB (ref 70–99)
Glucose-Capillary: 171 mg/dL — ABNORMAL HIGH (ref 70–99)
Glucose-Capillary: 135 mg/dL — ABNORMAL HIGH (ref 70–99)

## 2014-11-26 LAB — HEPARIN LEVEL (UNFRACTIONATED): HEPARIN UNFRACTIONATED: 0.49 [IU]/mL (ref 0.30–0.70)

## 2014-11-26 SURGERY — LEFT HEART CATHETERIZATION WITH CORONARY ANGIOGRAM
Anesthesia: LOCAL

## 2014-11-26 MED ORDER — LIDOCAINE HCL (PF) 1 % IJ SOLN
INTRAMUSCULAR | Status: AC
  Filled 2014-11-26: qty 30

## 2014-11-26 MED ORDER — MORPHINE SULFATE 2 MG/ML IJ SOLN
2.0000 mg | INTRAMUSCULAR | Status: DC | PRN
  Administered 2014-11-26: 2 mg via INTRAVENOUS
  Filled 2014-11-26: qty 1

## 2014-11-26 MED ORDER — SODIUM CHLORIDE 0.9 % IV SOLN
0.2500 mg/kg/h | INTRAVENOUS | Status: AC
  Filled 2014-11-26: qty 250

## 2014-11-26 MED ORDER — VERAPAMIL HCL 2.5 MG/ML IV SOLN
INTRAVENOUS | Status: AC
  Filled 2014-11-26: qty 2

## 2014-11-26 MED ORDER — BIVALIRUDIN 250 MG IV SOLR
INTRAVENOUS | Status: AC
  Filled 2014-11-26: qty 250

## 2014-11-26 MED ORDER — HEPARIN (PORCINE) IN NACL 2-0.9 UNIT/ML-% IJ SOLN
INTRAMUSCULAR | Status: AC
  Filled 2014-11-26: qty 1000

## 2014-11-26 MED ORDER — TICAGRELOR 90 MG PO TABS
90.0000 mg | ORAL_TABLET | Freq: Two times a day (BID) | ORAL | Status: DC
  Administered 2014-11-27: 10:00:00 90 mg via ORAL
  Filled 2014-11-26 (×2): qty 1

## 2014-11-26 MED ORDER — HEPARIN SODIUM (PORCINE) 1000 UNIT/ML IJ SOLN
INTRAMUSCULAR | Status: AC
  Filled 2014-11-26: qty 1

## 2014-11-26 MED ORDER — SODIUM CHLORIDE 0.9 % IV SOLN
1.7500 mg/kg/h | INTRAVENOUS | Status: DC
  Filled 2014-11-26: qty 250

## 2014-11-26 MED ORDER — SODIUM CHLORIDE 0.9 % IV SOLN
INTRAVENOUS | Status: AC
  Administered 2014-11-26: 17:00:00 via INTRAVENOUS

## 2014-11-26 MED ORDER — NITROGLYCERIN 1 MG/10 ML FOR IR/CATH LAB
INTRA_ARTERIAL | Status: AC
  Filled 2014-11-26: qty 10

## 2014-11-26 MED ORDER — HEPARIN (PORCINE) IN NACL 2-0.9 UNIT/ML-% IJ SOLN
INTRAMUSCULAR | Status: AC
  Filled 2014-11-26: qty 500

## 2014-11-26 MED ORDER — TICAGRELOR 90 MG PO TABS
ORAL_TABLET | ORAL | Status: AC
  Filled 2014-11-26: qty 2

## 2014-11-26 MED ORDER — ACETAMINOPHEN 325 MG PO TABS: 650.0000 mg | ORAL_TABLET | ORAL | Status: DC | PRN

## 2014-11-26 MED ORDER — ASPIRIN 81 MG PO CHEW
81.0000 mg | CHEWABLE_TABLET | Freq: Every day | ORAL | Status: DC
  Administered 2014-11-27: 81 mg via ORAL
  Filled 2014-11-26 (×2): qty 1

## 2014-11-26 MED ORDER — ONDANSETRON HCL 4 MG/2ML IJ SOLN: 4.0000 mg | Freq: Four times a day (QID) | INTRAMUSCULAR | Status: DC | PRN

## 2014-11-26 NOTE — Progress Notes (Signed)
ANTICOAGULATION CONSULT NOTE - Follow Up Consult  Pharmacy Consult for Heparin  Indication: NSTEMI  No Known Allergies  Labs:  Recent Labs  11/23/14 1731 11/23/14 2141 11/24/14 0116 11/24/14 0640  11/24/14 1530 11/24/14 1905 11/25/14 0431 11/26/14 0529  HGB 14.5  --  13.4  --   --   --   --  13.0 13.5  HCT 40.5  --  39.3  --   --   --   --  38.4* 40.1  PLT 174  --  184  --   --   --   --  167 160  APTT 29  --   --   --   --   --   --   --   --   LABPROT 14.4  --   --   --   --   --   --   --   --   INR 1.11  --   --   --   --   --   --   --   --   HEPARINUNFRC  --   --  0.29*  --   < > >2.20* 0.47 0.44 0.49  CREATININE 0.58 0.64 0.64  --   --   --   --   --   --   TROPONINI 6.36* 10.56* 5.92* 7.99*  --  4.27*  --   --   --   < > = values in this interval not displayed.  Estimated Creatinine Clearance: 144.8 mL/min (by C-G formula based on Cr of 0.64).  Assessment: 53 yo M on heparin for NSTEMI.  Heparin level is therapeutic on 1500 units/hr.  No change needed.  Follow-up after cath.  Goal of Therapy:  Heparin level 0.3-0.7 units/ml Monitor platelets by anticoagulation protocol: Yes   Plan:  1. Continue heparin drip at 1500 units/hr 3. Daily CBC and heparin level  3. Follow up after cath.  [employer], Pharm.D., BCPS Clinical Pharmacist Pager [phone] 11/26/2014 11:11 AM

## 2014-11-26 NOTE — CV Procedure (Signed)
[name] [name] is a 53 y.o. male    [id number] LOCATION:  FACILITY: [facility]  PHYSICIAN: Quay Burow, M.D. 02/03/1961   DATE OF PROCEDURE:  11/26/2014  DATE OF DISCHARGE:     CARDIAC CATHETERIZATION     History obtained from chart review.[name] [name] is a 53 year old moderately overweight. Caucasian male father of one son who goes to [employer] who works in [employer]. His primary care physician is [name] [facility]. He has a history of hypertension, diabetes, hyperlipidemia. He also has a strong family history of heart disease with father that had an MI at age 19 and a brother who died at age 57 with a history of coronary artery disease. He's had chest pain over the last several weeks and was seen by his primary care physician on 11/14/14 with PPIs. At that time his EKG showed no acute changes. He had fairly constant chest pain most the night last night and came to the emergency room today. His troponin was greater than 6 and his EKG shows inferolateral T-wave inversion. He is being admitted for a non-STEMI. He is being already been treated with aspirin and is on IV heparin. He was admitted over the weekend and his enzymes were cycled. His peak troponin was 10. He's remained pain-free on IV heparin. He presents now for cardiac catheterization.  PROCEDURE DESCRIPTION:   The patient was brought to the second floor [facility] cath lab in the postabsorptive state. He once not premedicated . His right breastwas prepped and shaved in usual sterile fashion. Xylocaine 1% was used for local anesthesia. A 6 French sheath was inserted into the right radial artery using standard Seldinger technique. The patient received 6000 units  of heparin  intravenously.  A 5 [place] TIG catheter and pigtail catheter were used for selective coronary angiography and left ventriculography respectively. Visipaque dye was used for the entirety of the case. Retrograde aortic, left  ventricular and pullback pressures were recorded.    HEMODYNAMICS:    AO SYSTOLIC/AO DIASTOLIC: 765/46   LV SYSTOLIC/LV DIASTOLIC: 503/54  ANGIOGRAPHIC RESULTS:   1. Left main; normal  2. LAD; 99% mid with some apical disease 3. Left circumflex; nondominant and normal. There was a moderate size ramus branch that had a long 60% segmental stenosis in the proximal third.  4. Right coronary artery; dominant and normal 5. Left ventriculography; RAO left ventriculogram was performed using  25 mL of Visipaque dye at 12 mL/second. The overall LVEF estimated  60 %  With wall motion abnormalities notable for a focal inferoapical wall motion variability  IMPRESSION:[name] [name] has a high-grade mid LAD lesion. We will proceed with PCI and stenting using drug-eluting stent, Angiomax and Brilenta.  Procedure description: The patient received Angiomax bolus with an ACT of 442. Total contrast used during the case was 210. Using an XB LAD 3 cm 6 [place] guide catheter along with a 0.14/190 cm long pro-water guidewire and a 2 mm x 12 mm long balloon predilatation was performed. Following this stenting was performed with a 2.5 mm x 18 mm long science drug-eluting stent deployed at 16 atm. 200 g of intracoronary technician was administered. Post dilatation was performed with a 2.75 mm x 15 mm long noncompliant balloon at 16 atm (2.81 mm) resulting in reduction of a 99% stenosis to 0% residual with TIMI-3 flow. The patient tolerated the procedure well. The guidewire and catheter were removed. The sheath was removed and a TR band was placed on the right  wrist to achieve patent hemostasis. The patient left the lab in stable condition.  Final impression: Successful PCI and stenting of high-grade mid LAD lesion and setting of non-STEMI with drug-eluting stent, Angiomax, and dual antiplatelet therapy using Brilenta. Patient will be hydrated overnight, and discharged home in the morning. I will see him back in the  office in 2-3 weeks for follow-up.  Lorretta Harp MD, [facility] 11/26/2014 3:52 PM

## 2014-11-26 NOTE — Progress Notes (Signed)
Patient Name: [name] [name] Date of Encounter: 11/26/2014     Principal Problem:   Non-STEMI (non-ST elevated myocardial infarction) Active Problems:   Insulin dependent diabetes mellitus   Hypertension   Hyperlipidemia   NSTEMI (non-ST elevated myocardial infarction)    SUBJECTIVE No further chest pain. Awaiting cardiac cath today at 12  CURRENT MEDS . [START ON 11/27/2014] aspirin EC  81 mg Oral Daily  . atorvastatin  80 mg Oral q1800  . insulin aspart  0-9 Units Subcutaneous TID WC  . metoprolol tartrate  25 mg Oral BID  . pantoprazole  40 mg Oral Daily  . sodium chloride  3 mL Intravenous Q12H    OBJECTIVE  Filed Vitals:   11/25/14 1013 11/25/14 1435 11/25/14 2050 11/26/14 0525  BP: 130/68 121/71 125/82 132/75  Pulse: 75 70 73 81  Temp:  98.6 F (37 C) 97.9 F (36.6 C) 98.2 F (36.8 C)  TempSrc:  Oral Oral Oral  Resp:  18 20 21   Height:      Weight:    265 lb 9.6 oz (120.475 kg)  SpO2:  98% 98% 98%    Intake/Output Summary (Last 24 hours) at 11/26/14 0741 Last data filed at 11/26/14 0500  Gross per 24 hour  Intake    614 ml  Output   1000 ml  Net   -386 ml   Filed Weights   11/24/14 0314 11/25/14 0400 11/26/14 0525  Weight: 264 lb 12.4 oz (120.1 kg) 266 lb 12.8 oz (121.02 kg) 265 lb 9.6 oz (120.475 kg)    PHYSICAL EXAM  General: Pleasant, NAD. Neuro: Alert and oriented X 3. Moves all extremities spontaneously. Psych: Normal affect. HEENT:  Normal  Neck: Supple without bruits or JVD. Lungs:  Resp regular and unlabored, CTA. Heart: RRR no s3, s4, or murmurs. Abdomen: Soft, non-tender, non-distended, BS + x 4.  Extremities: No clubbing, cyanosis or edema. DP/PT/Radials 2+ and equal bilaterally.  Accessory Clinical Findings  CBC  Recent Labs  11/25/14 0431 11/26/14 0529  WBC 8.9 8.1  HGB 13.0 13.5  HCT 38.4* 40.1  MCV 86.9 86.6  PLT 167 160   Basic Metabolic Panel  Recent Labs  11/23/14 1731 11/23/14 2141  11/24/14 0116  NA 139 139 140  K 3.9 3.5* 3.8  CL 101 100 103  CO2 24 25 23   GLUCOSE 120* 110* 113*  BUN 11 11 11   CREATININE 0.58 0.64 0.64  CALCIUM 9.4 9.2 9.2  MG 1.9 1.9  --    Liver Function Tests  Recent Labs  11/23/14 1731 11/23/14 2141  AST 41* 35  ALT 26 25  ALKPHOS 78 78  BILITOT 0.4 0.4  PROT 7.0 6.9  ALBUMIN 3.9 3.8   No results for input(s): LIPASE, AMYLASE in the last 72 hours. Cardiac Enzymes  Recent Labs  11/24/14 0116 11/24/14 0640 11/24/14 1530  TROPONINI 5.92* 7.99* 4.27*    Hemoglobin A1C  Recent Labs  11/24/14 0116  HGBA1C 8.3*   Fasting Lipid Panel  Recent Labs  11/24/14 0116  CHOL 137  HDL 34*  LDLCALC 86  TRIG 84  CHOLHDL 4.0   Thyroid Function Tests  Recent Labs  11/23/14 2141  TSH 2.400    TELE  NSR  Radiology/Studies  Dg Chest Portable 1 View  11/23/2014   CLINICAL DATA:  One-day history of chest pain.  EXAM: PORTABLE CHEST - 1 VIEW  COMPARISON:  None.  FINDINGS: The cardiac silhouette, mediastinal and hilar contours are within normal  limits given the AP projection and portable technique. The lungs are clear. No pleural effusion. The bony thorax is intact.  IMPRESSION: No acute cardiopulmonary findings.   Electronically Signed   By: Loralie Champagne M.D.   On: 11/23/2014 16:53    2D ECHO Study Date: 11/24/2014 LV EF: 60% Study Conclusions - Left ventricle: The cavity size was normal. Wall thickness was increased in a pattern of mild LVH. The estimated ejection fraction was 60%. Wall motion was normal; there were no regional wall motion abnormalities. - Right ventricle: The cavity size was normal. Systolic function was normal.   ASSESSMENT AND PLAN [name] [name] is a 53 y.o. male with a history of HTN, obesity, DM, HLD and family hx of CAD who was admitted to [facility] on 11/23/14 with chest pain and found to have NSTEMI.   NSTEMI- troponin 7.99--> 4.27 -- 2D ECHO 11/24/14 EF 60%. Mild LVH, no  RWMA. Normal RV.  -- Remains on heparin gtt. Nitro drip discontinued as he is chest pain free now.  -- LHC today. He is NPO -- Continue ASA, statin, lopressor 25mg  BID and heparin gtt.   HTN- well controlled,BB  HLD- continue statin  DM- SSI. Holding metformin with heart cath -- if CBG up, may need SSI.  Byrd Hesselbach R PA-C  Pager [phone]  I have seen and evaluated the patient this AM along with Janee Morn, KATHRYN R PA-C. I have reviewed all of the available data & chart.  I agree with her findings, examination as well as impression recommendations.  Admitted on Friday for NSTEMI - no further angina & Troponin trending down. Plan LHC-PCI today - via R Radial approach.  On statin & BB + ASA &IV Heparin.   Marykay Lex, M.D., M.S. Interventional Cardiologist   Pager # [phone]

## 2014-11-26 NOTE — Interval H&P Note (Signed)
Cath Lab Visit (complete for each Cath Lab visit)  Clinical Evaluation Leading to the Procedure:   ACS: Yes.    Non-ACS:    Anginal Classification: CCS IV  Anti-ischemic medical therapy: Minimal Therapy (1 class of medications)  Non-Invasive Test Results: No non-invasive testing performed  Prior CABG: No previous CABG      History and Physical Interval Note:  11/26/2014 2:20 PM  [name] [name]  has presented today for surgery, with the diagnosis of NSTEMI  The various methods of treatment have been discussed with the patient and family. After consideration of risks, benefits and other options for treatment, the patient has consented to  Procedure(s): LEFT HEART CATHETERIZATION WITH CORONARY ANGIOGRAM (N/A) as a surgical intervention .  The patient's history has been reviewed, patient examined, no change in status, stable for surgery.  I have reviewed the patient's chart and labs.  Questions were answered to the patient's satisfaction.     Runell Gess

## 2014-11-26 NOTE — Care Management Note (Addendum)
  Page 1 of 1   11/27/2014     11:28:52 AM CARE MANAGEMENT NOTE 11/27/2014  Patient:  [name],[name]   Account Number:  [id number]  Date Initiated:  11/26/2014  Documentation initiated by:  AMERSON,JULIE  Subjective/Objective Assessment:   Pt s/p PCI with DES on 12/14.  PTA, pt independent, lives with spouse.     Action/Plan:   Pt discharging on Brilinta therapy.  Free 30 day trial card and copay card given to pt with booklet.  Will check copay coverage.   Anticipated DC Date:  11/27/2014   Anticipated DC Plan:  HOME/SELF CARE      DC Planning Services  CM consult  Medication Assistance      Choice offered to / List presented to:             Status of service:  Completed, signed off Medicare Important Message given?  NO (If response is "NO", the following Medicare IM given date fields will be blank) Date Medicare IM given:   Medicare IM given by:   Date Additional Medicare IM given:   Additional Medicare IM given by:    Discharge Disposition:  HOME/SELF CARE  Per UR Regulation:  Reviewed for med. necessity/level of care/duration of stay  If discussed at Long Length of Stay Meetings, dates discussed:    Comments:  hx/o per [facility]  @ [employer] # [phone] BRILINTA 90 MG BID 30 DAY SUPPLY COVER-YES CO-PAY-$75.00 TIER-3 DRUG PRIOR APPROVAL - YES # [phone] PHARMACY: [facility], [facility] AND [facility] Patient has Brilinta Card for d/c.

## 2014-11-27 ENCOUNTER — Encounter (HOSPITAL_COMMUNITY): Payer: Self-pay | Admitting: Cardiovascular Disease

## 2014-11-27 ENCOUNTER — Telehealth: Payer: Self-pay | Admitting: *Deleted

## 2014-11-27 DIAGNOSIS — I25110 Atherosclerotic heart disease of native coronary artery with unstable angina pectoris: Secondary | ICD-10-CM

## 2014-11-27 DIAGNOSIS — Z9861 Coronary angioplasty status: Secondary | ICD-10-CM

## 2014-11-27 DIAGNOSIS — I2510 Atherosclerotic heart disease of native coronary artery without angina pectoris: Secondary | ICD-10-CM

## 2014-11-27 DIAGNOSIS — Z95820 Peripheral vascular angioplasty status with implants and grafts: Secondary | ICD-10-CM

## 2014-11-27 HISTORY — DX: Atherosclerotic heart disease of native coronary artery without angina pectoris: I25.10

## 2014-11-27 HISTORY — DX: Peripheral vascular angioplasty status with implants and grafts: Z95.820

## 2014-11-27 LAB — CBC
HCT: 38.1 % — ABNORMAL LOW (ref 39.0–52.0)
HEMOGLOBIN: 13.1 g/dL (ref 13.0–17.0)
MCH: 29.6 pg (ref 26.0–34.0)
MCHC: 34.4 g/dL (ref 30.0–36.0)
MCV: 86.2 fL (ref 78.0–100.0)
Platelets: 169 10*3/uL (ref 150–400)
RBC: 4.42 MIL/uL (ref 4.22–5.81)
RDW: 12.8 % (ref 11.5–15.5)
WBC: 8.8 10*3/uL (ref 4.0–10.5)

## 2014-11-27 LAB — BASIC METABOLIC PANEL
Anion gap: 12 (ref 5–15)
BUN: 11 mg/dL (ref 6–23)
CHLORIDE: 103 meq/L (ref 96–112)
CO2: 25 mEq/L (ref 19–32)
Calcium: 9.1 mg/dL (ref 8.4–10.5)
Creatinine, Ser: 0.77 mg/dL (ref 0.50–1.35)
GFR calc non Af Amer: >90 mL/min (ref >90)
Glucose, Bld: 165 mg/dL — ABNORMAL HIGH (ref 70–99)
POTASSIUM: 4.2 meq/L (ref 3.7–5.3)
Sodium: 140 mEq/L (ref 137–147)

## 2014-11-27 LAB — GLUCOSE, CAPILLARY: Glucose-Capillary: 152 mg/dL — ABNORMAL HIGH (ref 70–99)

## 2014-11-27 LAB — POCT ACTIVATED CLOTTING TIME: ACTIVATED CLOTTING TIME: 442 s

## 2014-11-27 MED ORDER — ASPIRIN 81 MG PO CHEW: 81.0000 mg | CHEWABLE_TABLET | Freq: Every day | ORAL | Status: DC

## 2014-11-27 MED ORDER — NITROGLYCERIN 0.4 MG SL SUBL: 0.4000 mg | SUBLINGUAL_TABLET | SUBLINGUAL | Status: DC | PRN

## 2014-11-27 MED ORDER — METFORMIN HCL 1000 MG PO TABS: 1000.0000 mg | ORAL_TABLET | Freq: Two times a day (BID) | ORAL | Status: AC

## 2014-11-27 MED ORDER — ACETAMINOPHEN 325 MG PO TABS: 650.0000 mg | ORAL_TABLET | ORAL | Status: DC | PRN

## 2014-11-27 MED ORDER — TICAGRELOR 90 MG PO TABS: 90.0000 mg | ORAL_TABLET | Freq: Two times a day (BID) | ORAL | Status: DC

## 2014-11-27 MED ORDER — ATORVASTATIN CALCIUM 80 MG PO TABS: 80.0000 mg | ORAL_TABLET | Freq: Every day | ORAL | Status: DC

## 2014-11-27 MED ORDER — METOPROLOL TARTRATE 25 MG PO TABS: 25.0000 mg | ORAL_TABLET | Freq: Two times a day (BID) | ORAL | Status: DC

## 2014-11-27 MED FILL — Sodium Chloride IV Soln 0.9%: INTRAVENOUS | Qty: 50 | Status: AC

## 2014-11-27 NOTE — Discharge Summary (Signed)
Physician Discharge Summary       Patient ID: [name] [name] MRN: [id number] DOB/AGE: February 22, 1961 53 y.o.  Admit date: 11/23/2014 Discharge date: 11/27/2014   Primary Cardiologist:New Dr. Allyson Sabal   Discharge Diagnoses:  Principal Problem:   Non-STEMI (non-ST elevated myocardial infarction) Active Problems:   S/P angioplasty with stent mLAD 11/26/14   Insulin dependent diabetes mellitus   Essential hypertension   Hyperlipidemia with target LDL less than 70   NSTEMI (non-ST elevated myocardial infarction)   CAD (coronary artery disease), native coronary artery, residual LCX disease    Discharged Condition: good  Procedures: 11/26/14 cardiac cath by Dr. Allyson Sabal 11/26/14 Successful PCI and stenting of high-grade mid LAD lesion and setting of non-STEMI with drug-eluting stent, Angiomax, and dual antiplatelet therapy using [facility] Course:  53 year old moderately overweight Caucasian male father of one son who goes to [employer] who works in [employer]. His primary care physician is Dr. Silvestre Moment [facility]. He has a history of hypertension, diabetes, hyperlipidemia. He also has a strong family history of heart disease with father that had an MI at age 41 and a brother who died at age 42 with a history of coronary artery disease. He's had chest pain over the last several weeks and was seen by his primary care physician on 11/14/14 with PPIs. At that time his EKG showed no acute changes. He had fairly constant chest pain most the night night prior to admit and came to the emergency room. His troponin was greater than 6 and his EKG shows inferolateral T-wave inversion. He was  admitted for a non-STEMI.   He was treated with aspirin and is on IV heparin. He was admitted over the weekend and his enzymes were cycled. His peak troponin was 10. He's remained pain-free on IV heparin. He presented for cardiac catheterization -results as below.  He rec'd Successful PCI and  stenting of high-grade mid LAD lesion and setting of non-STEMI with drug-eluting stent.  He did well post procedure and was seen by Dr. Katrinka Blazing and found stable for discharge.  He will be given 30 day free card for Brilinta.  He will follow with PCP for his diabetes.    He will be seen in the office by Dr. Hazle Coca PA and then later by Dr. Allyson Sabal.  Consults: None  Significant Diagnostic Studies:  BMET    Component Value Date/Time   NA 140 11/27/2014 0350   K 4.2 11/27/2014 0350   CL 103 11/27/2014 0350   CO2 25 11/27/2014 0350   GLUCOSE 165* 11/27/2014 0350   BUN 11 11/27/2014 0350   CREATININE 0.77 11/27/2014 0350   CALCIUM 9.1 11/27/2014 0350   GFRNONAA >90 11/27/2014 0350   GFRAA >90 11/27/2014 0350     CBC    Component Value Date/Time   WBC 8.8 11/27/2014 0350   RBC 4.42 11/27/2014 0350   HGB 13.1 11/27/2014 0350   HCT 38.1* 11/27/2014 0350   PLT 169 11/27/2014 0350   MCV 86.2 11/27/2014 0350   MCH 29.6 11/27/2014 0350   MCHC 34.4 11/27/2014 0350   RDW 12.8 11/27/2014 0350   Troponin pk 10.56   Pro BNP 391  Lipid Panel     Component Value Date/Time   CHOL 137 11/24/2014 0116   TRIG 84 11/24/2014 0116   HDL 34* 11/24/2014 0116   CHOLHDL 4.0 11/24/2014 0116   VLDL 17 11/24/2014 0116   LDLCALC 86 11/24/2014 0116   Hgb A1C 8.3 TSH 2.4, free T4  1.22  EKG: Normal sinus rhythm Minimal voltage criteria for LVH, may be normal variant Nonspecific T wave abnormality Abnormal ECG  Cardiac Cath: 1. Left main; normal  2. LAD; 99% mid with some apical disease 3. Left circumflex; nondominant and normal. There was a moderate size ramus branch that had a long 60% segmental stenosis in the proximal third.  4. Right coronary artery; dominant and normal 5. Left ventriculography; RAO left ventriculogram was performed using  25 mL of Visipaque dye at 12 mL/second. The overall LVEF estimated  60 % With wall motion abnormalities notable for a focal inferoapical wall motion  variability Successful PCI and stenting of high-grade mid LAD lesion and setting of non-STEMI with drug-eluting stent, Angiomax, and dual antiplatelet therapy using Brilenta  Echo: Study Conclusions  - Left ventricle: The cavity size was normal. Wall thickness was increased in a pattern of mild LVH. The estimated ejection fraction was 60%. Wall motion was normal; there were no regional wall motion abnormalities. - Right ventricle: The cavity size was normal. Systolic function was normal.  PORTABLE CHEST - 1 VIEW COMPARISON: None. FINDINGS: The cardiac silhouette, mediastinal and hilar contours are within normal limits given the AP projection and portable technique. The lungs are clear. No pleural effusion. The bony thorax is intact. IMPRESSION: No acute cardiopulmonary findings  Discharge Exam: Blood pressure 156/72, pulse 87, temperature 97.7 F (36.5 C), temperature source Oral, resp. rate 18, height 6' (1.829 m), weight 266 lb 12.1 oz (121 kg), SpO2 96 %.   Disposition: Final discharge disposition not confirmed      Discharge Instructions    Amb Referral to Cardiac Rehabilitation    Complete by:  As directed             Medication List    STOP taking these medications        metoprolol succinate 25 MG 24 hr tablet  Commonly known as:  TOPROL-XL     pravastatin 20 MG tablet  Commonly known as:  PRAVACHOL      TAKE these medications        acetaminophen 325 MG tablet  Commonly known as:  TYLENOL  Take 2 tablets (650 mg total) by mouth every 4 (four) hours as needed for headache or mild pain.     aspirin 81 MG chewable tablet  Chew 1 tablet (81 mg total) by mouth daily.     atorvastatin 80 MG tablet  Commonly known as:  LIPITOR  Take 1 tablet (80 mg total) by mouth daily at 6 PM.     esomeprazole 20 MG capsule  Commonly known as:  NEXIUM  Take 20 mg by mouth 2 (two) times daily.     insulin glargine 100 UNIT/ML injection  Commonly known  as:  LANTUS  Inject 45 Units into the skin every morning.     metFORMIN 1000 MG tablet  Commonly known as:  GLUCOPHAGE  Take 1 tablet (1,000 mg total) by mouth 2 (two) times daily with a meal.  Start taking on:  11/29/2014     metoprolol tartrate 25 MG tablet  Commonly known as:  LOPRESSOR  Take 1 tablet (25 mg total) by mouth 2 (two) times daily.     nitroGLYCERIN 0.4 MG SL tablet  Commonly known as:  NITROSTAT  Place 1 tablet (0.4 mg total) under the tongue every 5 (five) minutes x 3 doses as needed for chest pain.     pioglitazone 45 MG tablet  Commonly known as:  ACTOS  Take 45 mg by mouth daily.     ticagrelor 90 MG Tabs tablet  Commonly known as:  BRILINTA  Take 1 tablet (90 mg total) by mouth 2 (two) times daily.     valsartan 160 MG tablet  Commonly known as:  DIOVAN  Take 160 mg by mouth daily.       Follow-up Information    Follow up with Runell GessBERRY,JONATHAN J, MD On 12/13/2014.   Specialty:  Cardiology   Why:  at 3:30 PM with Boyce MediciBrittany Simmons, PA   Contact information:   [address] [place] [address] [phone]       Follow up with Runell GessBERRY,JONATHAN J, MD On 01/15/2015.   Specialty:  Cardiology   Why:  at 8:45 AM   Contact information:   [address] [place] [address] [phone]        Discharge Instructions: [facility] at [phone] if any bleeding, swelling or drainage at cath site.  May shower, no tub baths for 48 hours for groin sticks. No lifting over 5 pounds for 5 days.  No Driving for 5 days.    Heart Healthy diabetic diet.  No Metformin until the 17th it may interact with cath dye before then.  No work for 2 weeks.    Do not stop Brilinta, stopping could cause a heart attack.    Take 1 NTG, under your tongue, while sitting.  If no relief of pain may repeat NTG, one tab every 5 minutes up to 3 tablets total over 15 minutes.  If no relief CALL 911.  If you have  dizziness/lightheadness  while taking NTG, stop taking and call 911.   Signed: Leone BrandINGOLD,Lauralee Waters R Nurse Practitioner-Certified [facility]: [facility] 11/27/2014, 9:44 AM  Time spent on discharge : >30 minutes.

## 2014-11-27 NOTE — Progress Notes (Signed)
CARDIAC REHAB PHASE I   PRE:  Rate/Rhythm: 96 SR  BP:  Supine:   Sitting: 156/72  Standing:    SaO2: 97 RA  MODE:  Ambulation: 1000 ft   POST:  Rate/Rhythm: 105 ST  BP:  Supine:   Sitting: 166/85  Standing:    SaO2:  0810-0910 Pt tolerated ambulation well without c/o of cp or SOB. BP after walk 166/85. Completed MI and stent education with pt. He voices understanding. He has agreed to [employer]. CRP in [place], will sned referral.  Melina Copa RN 11/27/2014 9:05 AM

## 2014-11-27 NOTE — Progress Notes (Addendum)
UR completed Amilee Janvier K. Pratt Bress, RN, BSN, MSHL, CCM  11/27/2014 11:20 AM

## 2014-11-27 NOTE — Discharge Instructions (Signed)
Call [facility] at [phone] if any bleeding, swelling or drainage at cath site.  May shower, no tub baths for 48 hours for groin sticks. No lifting over 5 pounds for 5 days.  No Driving for 5 days.    Heart Healthy diabetic diet.  No Metformin until the 17th it may interact with cath dye before then.  No work for 2 weeks.    Do not stop Brilinta, stopping could cause a heart attack.    Take 1 NTG, under your tongue, while sitting.  If no relief of pain may repeat NTG, one tab every 5 minutes up to 3 tablets total over 15 minutes.  If no relief CALL 911.  If you have dizziness/lightheadness  while taking NTG, stop taking and call 911.

## 2014-11-27 NOTE — Progress Notes (Signed)
Subjective: No complaints  Objective: Vital signs in last 24 hours: Temp:  [97.6 F (36.4 C)-98.6 F (37 C)] 97.6 F (36.4 C) (12/15 0613) Pulse Rate:  [71-92] 83 (12/15 0613) Resp:  [18-20] 20 (12/15 0613) BP: (124-150)/(71-82) 147/78 mmHg (12/15 0613) SpO2:  [96 %-98 %] 96 % (12/15 0613) Weight:  [266 lb 12.1 oz (121 kg)] 266 lb 12.1 oz (121 kg) (12/15 0500) Weight change: 1 lb 2.5 oz (0.525 kg) Last BM Date: 11/26/14 Intake/Output from previous day: -362 12/14 0701 - 12/15 0700 In: 588.8 [P.O.:120; I.V.:468.8] Out: 951 [Urine:950; Stool:1] Intake/Output this shift: Total I/O In: -  Out: 525 [Urine:525]  PE: exam per MD:  General:Pleasant affect, NAD Skin:Warm and dry, brisk capillary refill HEENT:normocephalic, sclera clear, mucus membranes moist Neck:supple, no JVD, no bruits  Heart:S1S2 RRR without murmur, gallup, rub or click Lungs:clear without rales, rhonchi, or wheezes ZOX:WRUEAbd:soft, non tender, + BS, do not palpate liver spleen or masses Ext:no lower ext edema, 2+ pedal pulses, 2+ radial pulses Neuro:alert and oriented, MAE, follows commands, + facial symmetry  EKG without acute changes  Lab Results:  Recent Labs  11/26/14 0529 11/27/14 0350  WBC 8.1 8.8  HGB 13.5 13.1  HCT 40.1 38.1*  PLT 160 169   BMET  Recent Labs  11/27/14 0350  NA 140  K 4.2  CL 103  CO2 25  GLUCOSE 165*  BUN 11  CREATININE 0.77  CALCIUM 9.1    Recent Labs  11/24/14 1530  TROPONINI 4.27*    Lab Results  Component Value Date   CHOL 137 11/24/2014   HDL 34* 11/24/2014   LDLCALC 86 11/24/2014   TRIG 84 11/24/2014   CHOLHDL 4.0 11/24/2014   Lab Results  Component Value Date   HGBA1C 8.3* 11/24/2014     Lab Results  Component Value Date   TSH 2.400 11/23/2014     Studies/Results: No results found.  Medications: I have reviewed the patient's current medications. Scheduled Meds: . aspirin  81 mg Oral Daily  . atorvastatin  80 mg Oral  q1800  . insulin aspart  0-9 Units Subcutaneous TID WC  . metoprolol tartrate  25 mg Oral BID  . pantoprazole  40 mg Oral Daily  . ticagrelor  90 mg Oral BID   Continuous Infusions:  PRN Meds:.acetaminophen, morphine injection, nitroGLYCERIN, ondansetron (ZOFRAN) IV  Assessment/Plan: 53 year old moderately overweight. Caucasian male father of one son who goes to [employer] who works in [employer]. His primary care physician is Dr. Silvestre [facility]. He has a history of hypertension, diabetes, hyperlipidemia. He also has a strong family history of heart disease with father that had an MI at age 52 and a brother who died at age 52 with a history of coronary artery disease. He's had chest pain over the last several weeks and was seen by his primary care physician on 11/14/14 with PPIs. At that time his EKG showed no acute changes. He had fairly constant chest pain most the night prior to admit and came to the emergency room today. His troponin was greater than 6 and his EKG shows inferolateral T-wave inversion. He was being admitted 11/23/14 for a non-STEMI. He is being already been treated with aspirin and is on IV heparin. He was admitted over the weekend and his enzymes were cycled. His peak troponin was 10. He's remained pain-free on IV heparin. He presented now for cardiac catheterization.  Successful PCI and stenting  of high-grade mid LAD lesion and setting of non-STEMI with drug-eluting stent, Angiomax, and dual antiplatelet therapy using Brilinta.    Principal Problem:   Non-STEMI (non-ST elevated myocardial infarction)- stent to LAD DES, continue DAPT for 1 year  Active Problems:   Insulin dependent diabetes mellitus- followed by PCP   Essential hypertension still mildly elevated - add back his diovan - he is on BB    Hyperlipidemia with target LDL less than 70- on lipitor 80   NSTEMI (non-ST elevated myocardial infarction)   CAD see above and residual disease Left circumflex;  nondominant and normal. There was a moderate size ramus branch that had a long 60% segmental stenosis in the proximal third treat medically EF 60%.   Ambulate with rehab and plan for discharge.   LOS: 4 days   Time spent with pt. :15 minutes. [facility] R  Nurse Practitioner Certified Pager [phone] or after 5pm and on weekends call [phone] 11/27/2014, 7:29 AM

## 2014-11-27 NOTE — Telephone Encounter (Signed)
Prior auth for brilinta complete and approved [id number]

## 2014-11-28 ENCOUNTER — Telehealth: Payer: Self-pay | Admitting: Cardiovascular Disease

## 2014-11-28 MED ORDER — TICAGRELOR 90 MG PO TABS: 90.0000 mg | ORAL_TABLET | Freq: Two times a day (BID) | ORAL | Status: DC

## 2014-11-28 NOTE — Telephone Encounter (Signed)
[name] [name] is calling from [employer]

## 2014-11-28 NOTE — Telephone Encounter (Signed)
Original PA done under his wife's number.  [place] Dec with [employer] reworked the PA under patient's ID #. Brilinta authorized until 11/29/2015.  PA# [id number].  Info sent to pharmacy with refills.

## 2014-12-13 ENCOUNTER — Ambulatory Visit (INDEPENDENT_AMBULATORY_CARE_PROVIDER_SITE_OTHER): Payer: PRIVATE HEALTH INSURANCE | Admitting: Cardiology

## 2014-12-13 ENCOUNTER — Telehealth (HOSPITAL_COMMUNITY): Payer: Self-pay | Admitting: *Deleted

## 2014-12-13 ENCOUNTER — Encounter: Payer: Self-pay | Admitting: Cardiology

## 2014-12-13 VITALS — BP 130/81 | HR 80 | Ht 72.0 in | Wt 267.0 lb

## 2014-12-13 DIAGNOSIS — Z9861 Coronary angioplasty status: Secondary | ICD-10-CM

## 2014-12-13 DIAGNOSIS — I2510 Atherosclerotic heart disease of native coronary artery without angina pectoris: Secondary | ICD-10-CM

## 2014-12-13 MED ORDER — METOPROLOL TARTRATE 25 MG PO TABS: 37.5000 mg | ORAL_TABLET | Freq: Two times a day (BID) | ORAL | Status: DC

## 2014-12-13 NOTE — Patient Instructions (Signed)
We have increased your lopressor to 1 1/2 tabs to equal 37.5 mg two times a day  Follow up with as already scheduled

## 2014-12-13 NOTE — Telephone Encounter (Signed)
Received signed MD order. Pt called and message left to please contact cardiac rehab for sign up.  Contact information provided. Alanson Aly, BSN

## 2014-12-13 NOTE — Progress Notes (Signed)
12/13/2014 [name] [name]   [birth year]  [id number]  Primary Physician Joycelyn Rua, MD Primary Cardiologist: Dr. Allyson Sabal  HPI:  The patient is a 53 y/o male with a h/o HTN, DM, HLD and strong family history of CAD who was recently diagnosed with NSTEMI. He presents to clinic today for post-hospital f/u. He is accompanied by his son. He was admitted to [facility] and underwent LHC by Dr. Allyson Sabal. This revealed a high grade mid lesion in the LAD. This was successfully treated with a drug-eluting stent. He has preserved LV function with an ejection fraction of 60%. He was placed on dual antiplatelet therapy with aspirin plus Brilinta. He was also placed on a beta blocker, statin, and ARB.  Today in clinic, he reports that he has been doing fairly well. He denies any recurrent chest pain. No dyspnea. No palpitations, lightheadedness, dizziness, syncope/near-syncope. He reports full medication compliance with aspirin and Brilinta. He is tolerating his medications well without side effects. His right radial access site is also stable without complications.    Current Outpatient Prescriptions  Medication Sig Dispense Refill  . acetaminophen (TYLENOL) 325 MG tablet Take 2 tablets (650 mg total) by mouth every 4 (four) hours as needed for headache or mild pain.    [name] aspirin 81 MG chewable tablet Chew 1 tablet (81 mg total) by mouth daily.    [name] atorvastatin (LIPITOR) 80 MG tablet Take 1 tablet (80 mg total) by mouth daily at 6 PM. 30 tablet 6  . esomeprazole (NEXIUM) 20 MG capsule Take 20 mg by mouth 2 (two) times daily.    . insulin glargine (LANTUS) 100 UNIT/ML injection Inject 45 Units into the skin every morning.    . metFORMIN (GLUCOPHAGE) 1000 MG tablet Take 1 tablet (1,000 mg total) by mouth 2 (two) times daily with a meal.    . metoprolol tartrate (LOPRESSOR) 25 MG tablet Take 1 tablet (25 mg total) by mouth 2 (two) times daily. 60 tablet 6  . nitroGLYCERIN (NITROSTAT) 0.4 MG SL tablet Place  1 tablet (0.4 mg total) under the tongue every 5 (five) minutes x 3 doses as needed for chest pain. 25 tablet 6  . pioglitazone (ACTOS) 45 MG tablet Take 45 mg by mouth daily.    . ticagrelor (BRILINTA) 90 MG TABS tablet Take 1 tablet (90 mg total) by mouth 2 (two) times daily. 60 tablet 11  . valsartan (DIOVAN) 160 MG tablet Take 160 mg by mouth daily.     No current facility-administered medications for this visit.    No Known Allergies  History   Social History  . Marital Status: Unknown    Spouse Name: N/A    Number of Children: N/A  . Years of Education: N/A   Occupational History  . Not on file.   Social History Main Topics  . Smoking status: Former Games developer  . Smokeless tobacco: Not on file  . Alcohol Use: Yes     Comment: occasional  . Drug Use: No  . Sexual Activity: Not on file   Other Topics Concern  . Not on file   Social History Narrative     Review of Systems: General: negative for chills, fever, night sweats or weight changes.  Cardiovascular: negative for chest pain, dyspnea on exertion, edema, orthopnea, palpitations, paroxysmal nocturnal dyspnea or shortness of breath Dermatological: negative for rash Respiratory: negative for cough or wheezing Urologic: negative for hematuria Abdominal: negative for nausea, vomiting, diarrhea, bright red blood per rectum, melena, or  hematemesis Neurologic: negative for visual changes, syncope, or dizziness All other systems reviewed and are otherwise negative except as noted above.    Blood pressure 130/81, pulse 80, height 6' (1.829 m), weight 267 lb (121.11 kg).  General appearance: alert, cooperative and no distress Neck: no carotid bruit and no JVD Lungs: clear to auscultation bilaterally Heart: regular rate and rhythm, S1, S2 normal, no murmur, click, rub or gallop Extremities: no LEE Pulses: 2+ and symmetric Skin: Skin color, texture, turgor normal. No rashes or lesions Neurologic: Grossly normal  EKG  Not performed  ASSESSMENT AND PLAN:   1. Non-ST elevation myocardial infarction/ CAD: status post PCI plus drug-eluting stent to the mid LAD. No recurrent angina. Continue dual antiplatelet therapy with aspirin plus Brilinta for a minimum of one year given newly placed drug-eluting stent. We'll also continue him on his beta blocker, statin and ARB. His heart rate is elevated at 80 bpm and blood pressure is 130/81. We will further increase his Metroprolol to 37.5 mg twice a day for better beta blockade.  2. Hypertension: At goal for diabetes at 130/81. However we are increasing his Metroprolol for better beta blockage, given recent MI.   3. Hyperlipidemia: Continue high-dose statin therapy. Dr. Allyson SabalBerry will likely decrease his dose in 4 weeks at his next office visit.  4. Diabetes: Followed by his PCP.  PLAN  He appears to be doing well from a cardiovascular standpoint post-MI. He has been instructed to follow-up with Dr. Allyson SabalBerry in 4 weeks for repeat evaluation.  Dineen KidSIMMONS, BRITTAINYPA-C 12/13/2014 3:37 PM

## 2014-12-17 MED ORDER — METOPROLOL TARTRATE 25 MG PO TABS: 37.5000 mg | ORAL_TABLET | Freq: Two times a day (BID) | ORAL | Status: DC

## 2014-12-17 NOTE — Addendum Note (Signed)
Addended by: Lindell Spar on: 12/17/2014 08:13 AM   Modules accepted: Orders, Medications

## 2014-12-20 ENCOUNTER — Telehealth (HOSPITAL_COMMUNITY): Payer: Self-pay | Admitting: *Deleted

## 2014-12-20 NOTE — Telephone Encounter (Signed)
Left messages on home phone and cell phone listed in epic regarding referral to cardiac rehab.  Letter sent to pt to please contact cardiac rehab for sign up for rehab classes. Alanson Aly, BSN

## 2014-12-21 ENCOUNTER — Other Ambulatory Visit: Payer: Self-pay | Admitting: Cardiology

## 2014-12-21 NOTE — Telephone Encounter (Signed)
Rx(s) sent to pharmacy electronically.  

## 2014-12-26 ENCOUNTER — Other Ambulatory Visit: Payer: Self-pay

## 2014-12-26 MED ORDER — ATORVASTATIN CALCIUM 80 MG PO TABS: 80.0000 mg | ORAL_TABLET | Freq: Every day | ORAL | Status: DC

## 2014-12-26 NOTE — Telephone Encounter (Signed)
Rx sent to pharmacy   

## 2015-01-02 ENCOUNTER — Other Ambulatory Visit: Payer: Self-pay | Admitting: *Deleted

## 2015-01-02 MED ORDER — ATORVASTATIN CALCIUM 80 MG PO TABS: 80.0000 mg | ORAL_TABLET | Freq: Every day | ORAL | Status: DC

## 2015-01-02 NOTE — Telephone Encounter (Signed)
Rx refill sent to patient pharmacy   

## 2015-01-03 ENCOUNTER — Encounter (HOSPITAL_COMMUNITY)
Admission: RE | Admit: 2015-01-03 | Discharge: 2015-01-03 | Disposition: A | Discharge status: Home / Self Care | Payer: PRIVATE HEALTH INSURANCE | Source: Ambulatory Visit | Attending: Cardiovascular Disease | Admitting: Cardiovascular Disease

## 2015-01-03 DIAGNOSIS — M4322 Fusion of spine, cervical region: Secondary | ICD-10-CM | POA: Insufficient documentation

## 2015-01-03 DIAGNOSIS — I2510 Atherosclerotic heart disease of native coronary artery without angina pectoris: Secondary | ICD-10-CM | POA: Insufficient documentation

## 2015-01-03 DIAGNOSIS — I10 Essential (primary) hypertension: Secondary | ICD-10-CM | POA: Insufficient documentation

## 2015-01-03 DIAGNOSIS — Z794 Long term (current) use of insulin: Secondary | ICD-10-CM | POA: Insufficient documentation

## 2015-01-03 DIAGNOSIS — E785 Hyperlipidemia, unspecified: Secondary | ICD-10-CM | POA: Insufficient documentation

## 2015-01-03 DIAGNOSIS — I252 Old myocardial infarction: Secondary | ICD-10-CM | POA: Insufficient documentation

## 2015-01-03 DIAGNOSIS — Z79899 Other long term (current) drug therapy: Secondary | ICD-10-CM | POA: Insufficient documentation

## 2015-01-03 DIAGNOSIS — E663 Overweight: Secondary | ICD-10-CM | POA: Insufficient documentation

## 2015-01-03 DIAGNOSIS — E119 Type 2 diabetes mellitus without complications: Secondary | ICD-10-CM | POA: Insufficient documentation

## 2015-01-03 DIAGNOSIS — Z87891 Personal history of nicotine dependence: Secondary | ICD-10-CM | POA: Insufficient documentation

## 2015-01-03 DIAGNOSIS — Z5189 Encounter for other specified aftercare: Secondary | ICD-10-CM | POA: Insufficient documentation

## 2015-01-03 DIAGNOSIS — Z955 Presence of coronary angioplasty implant and graft: Secondary | ICD-10-CM | POA: Insufficient documentation

## 2015-01-03 NOTE — Progress Notes (Signed)
Cardiac Rehab Medication Review by a Pharmacist  Does the patient  feel that his/her medications are working for him/her?  yes  Has the patient been experiencing any side effects to the medications prescribed?  no  Does the patient measure his/her own blood pressure or blood glucose at home?  yes   Does the patient have any problems obtaining medications due to transportation or finances?   no  Understanding of regimen: excellent Understanding of indications: excellent Potential of compliance: excellent    Pharmacist comments:  Pleasant 54 yo M presents to cardiac rehab orientation, walking unassisted. Patient in great spirits with great understanding of medication he is taking and why. He states he has a great system to help remember to take all of his mediations. He takes his blood sugar daily and BP occasionally. Explains he will try to make taking his BP a habit. All questions were answered during our time.   Thank you for allowing pharmacy to be part of this patient's care team  Tiara Maultsby M. Harmonii Karle, Pharm.D Clinical Pharmacy Resident Pager: [phone] 01/03/2015 .8:22 AM

## 2015-01-07 ENCOUNTER — Encounter (HOSPITAL_COMMUNITY): Payer: PRIVATE HEALTH INSURANCE

## 2015-01-08 ENCOUNTER — Telehealth (HOSPITAL_COMMUNITY): Payer: Self-pay | Admitting: Cardiac Rehabilitation

## 2015-01-08 NOTE — Telephone Encounter (Signed)
Phone message received from pt stating he will be absent from cardiac rehab today due to inclement weather.

## 2015-01-09 ENCOUNTER — Encounter (HOSPITAL_COMMUNITY)
Admission: RE | Admit: 2015-01-09 | Discharge: 2015-01-09 | Disposition: A | Discharge status: Home / Self Care | Payer: PRIVATE HEALTH INSURANCE | Source: Ambulatory Visit | Attending: Cardiovascular Disease | Admitting: Cardiovascular Disease

## 2015-01-09 DIAGNOSIS — I252 Old myocardial infarction: Secondary | ICD-10-CM | POA: Diagnosis not present

## 2015-01-09 DIAGNOSIS — Z87891 Personal history of nicotine dependence: Secondary | ICD-10-CM | POA: Diagnosis not present

## 2015-01-09 DIAGNOSIS — E663 Overweight: Secondary | ICD-10-CM | POA: Diagnosis not present

## 2015-01-09 DIAGNOSIS — E119 Type 2 diabetes mellitus without complications: Secondary | ICD-10-CM | POA: Diagnosis not present

## 2015-01-09 DIAGNOSIS — Z5189 Encounter for other specified aftercare: Secondary | ICD-10-CM | POA: Diagnosis present

## 2015-01-09 DIAGNOSIS — E785 Hyperlipidemia, unspecified: Secondary | ICD-10-CM | POA: Diagnosis not present

## 2015-01-09 DIAGNOSIS — Z794 Long term (current) use of insulin: Secondary | ICD-10-CM | POA: Diagnosis not present

## 2015-01-09 DIAGNOSIS — Z955 Presence of coronary angioplasty implant and graft: Secondary | ICD-10-CM | POA: Diagnosis not present

## 2015-01-09 DIAGNOSIS — Z79899 Other long term (current) drug therapy: Secondary | ICD-10-CM | POA: Diagnosis not present

## 2015-01-09 DIAGNOSIS — I2510 Atherosclerotic heart disease of native coronary artery without angina pectoris: Secondary | ICD-10-CM | POA: Diagnosis not present

## 2015-01-09 DIAGNOSIS — I10 Essential (primary) hypertension: Secondary | ICD-10-CM | POA: Diagnosis not present

## 2015-01-09 DIAGNOSIS — M4322 Fusion of spine, cervical region: Secondary | ICD-10-CM | POA: Diagnosis not present

## 2015-01-09 LAB — GLUCOSE, CAPILLARY
Glucose-Capillary: 183 mg/dL — ABNORMAL HIGH (ref 70–99)
Glucose-Capillary: 153 mg/dL — ABNORMAL HIGH (ref 70–99)

## 2015-01-09 NOTE — Progress Notes (Signed)
Pt started cardiac rehab today.  Pt tolerated light exercise without difficulty.  VSS, telemetry-sinus rhythm, rare PAC, PVC.  Asymptomatic.  Pt oriented to exercise equipment and routine.  Understanding verbalized.

## 2015-01-11 ENCOUNTER — Encounter (HOSPITAL_COMMUNITY)
Admission: RE | Admit: 2015-01-11 | Discharge: 2015-01-11 | Disposition: A | Discharge status: Home / Self Care | Payer: PRIVATE HEALTH INSURANCE | Source: Ambulatory Visit | Attending: Cardiovascular Disease | Admitting: Cardiovascular Disease

## 2015-01-11 ENCOUNTER — Encounter (HOSPITAL_COMMUNITY): Payer: Self-pay

## 2015-01-11 DIAGNOSIS — Z5189 Encounter for other specified aftercare: Secondary | ICD-10-CM | POA: Diagnosis not present

## 2015-01-11 LAB — GLUCOSE, CAPILLARY: Glucose-Capillary: 117 mg/dL — ABNORMAL HIGH (ref 70–99)

## 2015-01-11 NOTE — Progress Notes (Addendum)
PSYCHOSOCIAL ASSESSMENT  Pt psychosocial assessment reveals no barriers to rehab participation.  Pt quality of life is slightly altered by his  physical constraints which limits his ability to perform tasks as prior to his illness. He also has health related anxiety associated with his cardiac illness.  Pt reports his work is very stressful and he is ameaniable to making a job change to remove this stressor.   Pt exhibits positive coping skills and has supportive family.  Offered emotional support and reassurance.   Pt goals for cardiac rehab are to develop a exercise plan and make lifestyle modifications to improve his health.  Pt enjoys hunting, biking and gardening.  Will continue to monitor.

## 2015-01-14 ENCOUNTER — Encounter (HOSPITAL_COMMUNITY)
Admission: RE | Admit: 2015-01-14 | Discharge: 2015-01-14 | Disposition: A | Discharge status: Home / Self Care | Payer: PRIVATE HEALTH INSURANCE | Source: Ambulatory Visit | Attending: Cardiovascular Disease | Admitting: Cardiovascular Disease

## 2015-01-14 ENCOUNTER — Other Ambulatory Visit: Payer: Self-pay

## 2015-01-14 DIAGNOSIS — Z5189 Encounter for other specified aftercare: Secondary | ICD-10-CM | POA: Diagnosis present

## 2015-01-14 DIAGNOSIS — E663 Overweight: Secondary | ICD-10-CM | POA: Diagnosis not present

## 2015-01-14 DIAGNOSIS — E119 Type 2 diabetes mellitus without complications: Secondary | ICD-10-CM | POA: Diagnosis not present

## 2015-01-14 DIAGNOSIS — E785 Hyperlipidemia, unspecified: Secondary | ICD-10-CM | POA: Insufficient documentation

## 2015-01-14 DIAGNOSIS — Z955 Presence of coronary angioplasty implant and graft: Secondary | ICD-10-CM | POA: Diagnosis not present

## 2015-01-14 DIAGNOSIS — I2510 Atherosclerotic heart disease of native coronary artery without angina pectoris: Secondary | ICD-10-CM | POA: Insufficient documentation

## 2015-01-14 DIAGNOSIS — I10 Essential (primary) hypertension: Secondary | ICD-10-CM | POA: Diagnosis not present

## 2015-01-14 DIAGNOSIS — I252 Old myocardial infarction: Secondary | ICD-10-CM | POA: Insufficient documentation

## 2015-01-14 DIAGNOSIS — M4322 Fusion of spine, cervical region: Secondary | ICD-10-CM | POA: Diagnosis not present

## 2015-01-14 DIAGNOSIS — Z87891 Personal history of nicotine dependence: Secondary | ICD-10-CM | POA: Insufficient documentation

## 2015-01-14 DIAGNOSIS — Z79899 Other long term (current) drug therapy: Secondary | ICD-10-CM | POA: Insufficient documentation

## 2015-01-14 DIAGNOSIS — Z794 Long term (current) use of insulin: Secondary | ICD-10-CM | POA: Diagnosis not present

## 2015-01-14 LAB — GLUCOSE, CAPILLARY: GLUCOSE-CAPILLARY: 144 mg/dL — AB (ref 70–99)

## 2015-01-14 MED ORDER — TICAGRELOR 90 MG PO TABS: ORAL_TABLET | ORAL | Status: DC

## 2015-01-14 NOTE — Telephone Encounter (Signed)
Rx sent to pharmacy   

## 2015-01-15 ENCOUNTER — Encounter: Payer: Self-pay | Admitting: Cardiovascular Disease

## 2015-01-15 ENCOUNTER — Ambulatory Visit (INDEPENDENT_AMBULATORY_CARE_PROVIDER_SITE_OTHER): Payer: PRIVATE HEALTH INSURANCE | Admitting: Cardiovascular Disease

## 2015-01-15 VITALS — BP 118/78 | HR 66 | Ht 72.0 in | Wt 264.0 lb

## 2015-01-15 DIAGNOSIS — Z9889 Other specified postprocedural states: Secondary | ICD-10-CM

## 2015-01-15 DIAGNOSIS — E785 Hyperlipidemia, unspecified: Secondary | ICD-10-CM

## 2015-01-15 DIAGNOSIS — Z79899 Other long term (current) drug therapy: Secondary | ICD-10-CM

## 2015-01-15 DIAGNOSIS — Z95820 Peripheral vascular angioplasty status with implants and grafts: Secondary | ICD-10-CM

## 2015-01-15 DIAGNOSIS — I10 Essential (primary) hypertension: Secondary | ICD-10-CM

## 2015-01-15 NOTE — Patient Instructions (Signed)
Your physician wants you to follow-up in: 6 Months You will receive a reminder letter in the mail two months in advance. If you don't receive a letter, please call our office to schedule the follow-up appointment.  Your physician recommends that you return for lab work in: Fasting lipids Liver

## 2015-01-15 NOTE — Assessment & Plan Note (Signed)
History of hypertension with blood pressure measured 118/78. He is on metoprolol and valsartan. Continue current meds at current dosing

## 2015-01-15 NOTE — Progress Notes (Signed)
01/15/2015 [name] [name]   November 20, 1961  [id number]  Primary Physician [name] Rua, MD Primary Cardiologist: [name] Gess MD [name] [name]   HPI:  [name] [name] is a 54 year old moderately overweight. Caucasian male father of one son who goes to [name] [name] who works in [employer]. His primary care physician is Dr. Silvestre [name] [name] [name]. I recently saw him during hospitalization mid-December.He has a history of hypertension, diabetes, hyperlipidemia. He also has a strong family history of heart disease with father that had an MI at age 109 and a brother who died at age 67 with a history of coronary artery disease. He's had chest pain over the last several weeks and was seen by his primary care physician on 11/14/14 with PPIs. At that time his EKG showed no acute changes. He had fairly constant chest pain most the night last night and came to the emergency room today. His troponin was greater than 6 and his EKG shows inferolateral T-wave inversion. He is being admitted for a non-STEMI 11/23/14. On 11/26/14 he underwent cardiac catheterization performed by myself radially revealing a 90% mid LAD lesion which I stented using a drug-eluting stent. He had moderate ramus branch disease and normal LV function. He is on Brilenta, dual antiplatelet therapy. He denies chest pain or shortness of breath and is participating in the cardiac rehabilitation program.   Current Outpatient Prescriptions  Medication Sig Dispense Refill  . acetaminophen (TYLENOL) 325 MG tablet Take 2 tablets (650 mg total) by mouth every 4 (four) hours as needed for headache or mild pain.    [name] aspirin 81 MG chewable tablet Chew 1 tablet (81 mg total) by mouth daily.    [name] atorvastatin (LIPITOR) 80 MG tablet Take 1 tablet (80 mg total) by mouth daily at 6 PM. 30 tablet 6  . insulin glargine (LANTUS) 100 UNIT/ML injection Inject 45 Units into the skin every morning.    . metFORMIN (GLUCOPHAGE) 1000 MG  tablet Take 1 tablet (1,000 mg total) by mouth 2 (two) times daily with a meal.    . metoprolol tartrate (LOPRESSOR) 25 MG tablet Take 1.5 tablets (37.5 mg total) by mouth 2 (two) times daily. 90 tablet 6  . nitroGLYCERIN (NITROSTAT) 0.4 MG SL tablet Place 1 tablet (0.4 mg total) under the tongue every 5 (five) minutes x 3 doses as needed for chest pain. 25 tablet 6  . pioglitazone (ACTOS) 45 MG tablet Take 45 mg by mouth daily.    . ticagrelor (BRILINTA) 90 MG TABS tablet TAKE 1 TABLET (90 MG TOTAL) BY MOUTH 2 (TWO) TIMES DAILY. THIS IS FOR 30 DAY FREE CARD 180 tablet 1  . valsartan (DIOVAN) 160 MG tablet Take 160 mg by mouth daily.     No current facility-administered medications for this visit.    No Known Allergies  History   Social History  . Marital Status: Unknown    Spouse Name: N/A    Number of Children: N/A  . Years of Education: N/A   Occupational History  . Not on file.   Social History Main Topics  . Smoking status: Former Games developer  . Smokeless tobacco: Not on file  . Alcohol Use: Yes     Comment: occasional  . Drug Use: No  . Sexual Activity: Not on file   Other Topics Concern  . Not on file   Social History Narrative     Review of Systems: General: negative for chills, fever, night sweats or weight changes.  Cardiovascular: negative for chest pain, dyspnea on exertion, edema, orthopnea, palpitations, paroxysmal nocturnal dyspnea or shortness of breath Dermatological: negative for rash Respiratory: negative for cough or wheezing Urologic: negative for hematuria Abdominal: negative for nausea, vomiting, diarrhea, bright red blood per rectum, melena, or hematemesis Neurologic: negative for visual changes, syncope, or dizziness All other systems reviewed and are otherwise negative except as noted above.    Blood pressure 118/78, pulse 66, height 6' (1.829 m), weight 264 lb (119.75 kg).  General appearance: alert and no distress Neck: no adenopathy, no  carotid bruit, no JVD, supple, symmetrical, trachea midline and thyroid not enlarged, symmetric, no tenderness/mass/nodules Lungs: clear to auscultation bilaterally Heart: regular rate and rhythm, S1, S2 normal, no murmur, click, rub or gallop Extremities: extremities normal, atraumatic, no cyanosis or edema  EKG not performed today  ASSESSMENT AND PLAN:   S/P angioplasty with stent mLAD 11/26/14 History of CAD status post non-STEMI 11/23/14. He N [name] [name] catheterization 3 days later revealing a 99% mid LAD lesion which was stented using a drug-eluting stent. He had moderate disease in the ramus intermedius branch otherwise no significant CAD and normal LV function. He is on dual antiplatelet therapy with Brilenta and denies chest pain or shortness of breath. He is participating in cardiac rehabilitation.   Hyperlipidemia with target LDL less than 70 History of hyperlipidemia currently on high-dose statin drug. His recent lipid profile performed 11/24/14 revealed a total cholesterol 137, LDL 86 and HDL of 34. We will recheck a lipid and liver profile   Essential hypertension History of hypertension with blood pressure measured 118/78. He is on metoprolol and valsartan. Continue current meds at current dosing       [name] Gess MD [facility], [facility] 01/15/2015 9:38 AM

## 2015-01-15 NOTE — Assessment & Plan Note (Signed)
History of CAD status post non-STEMI 11/23/14. He N [facility] catheterization 3 days later revealing a 99% mid LAD lesion which was stented using a drug-eluting stent. He had moderate disease in the ramus intermedius branch otherwise no significant CAD and normal LV function. He is on dual antiplatelet therapy with Brilenta and denies chest pain or shortness of breath. He is participating in cardiac rehabilitation.

## 2015-01-15 NOTE — Assessment & Plan Note (Signed)
History of hyperlipidemia currently on high-dose statin drug. His recent lipid profile performed 11/24/14 revealed a total cholesterol 137, LDL 86 and HDL of 34. We will recheck a lipid and liver profile

## 2015-01-16 ENCOUNTER — Encounter (HOSPITAL_COMMUNITY)
Admission: RE | Admit: 2015-01-16 | Discharge: 2015-01-16 | Disposition: A | Discharge status: Home / Self Care | Payer: PRIVATE HEALTH INSURANCE | Source: Ambulatory Visit | Attending: Cardiovascular Disease | Admitting: Cardiovascular Disease

## 2015-01-16 DIAGNOSIS — Z5189 Encounter for other specified aftercare: Secondary | ICD-10-CM | POA: Diagnosis not present

## 2015-01-18 ENCOUNTER — Encounter (HOSPITAL_COMMUNITY)
Admission: RE | Admit: 2015-01-18 | Discharge: 2015-01-18 | Disposition: A | Discharge status: Home / Self Care | Payer: PRIVATE HEALTH INSURANCE | Source: Ambulatory Visit | Attending: Cardiovascular Disease | Admitting: Cardiovascular Disease

## 2015-01-18 DIAGNOSIS — Z5189 Encounter for other specified aftercare: Secondary | ICD-10-CM | POA: Diagnosis not present

## 2015-01-18 NOTE — Progress Notes (Signed)
Reviewed home exercise guidelines with patient including endpoints, temperature precautions, target heart rate and rate of perceived exertion. Pt plans to walk as his mode of home exercise. Pt voices understanding of instructions given. Serinity Ware M Bettymae Yott, MS, ACSM CCEP  

## 2015-01-21 ENCOUNTER — Encounter (HOSPITAL_COMMUNITY)
Admission: RE | Admit: 2015-01-21 | Discharge: 2015-01-21 | Disposition: A | Discharge status: Home / Self Care | Payer: PRIVATE HEALTH INSURANCE | Source: Ambulatory Visit | Attending: Cardiovascular Disease | Admitting: Cardiovascular Disease

## 2015-01-21 DIAGNOSIS — Z5189 Encounter for other specified aftercare: Secondary | ICD-10-CM | POA: Diagnosis not present

## 2015-01-23 ENCOUNTER — Encounter (HOSPITAL_COMMUNITY)
Admission: RE | Admit: 2015-01-23 | Discharge: 2015-01-23 | Disposition: A | Discharge status: Home / Self Care | Payer: PRIVATE HEALTH INSURANCE | Source: Ambulatory Visit | Attending: Cardiovascular Disease | Admitting: Cardiovascular Disease

## 2015-01-23 DIAGNOSIS — Z5189 Encounter for other specified aftercare: Secondary | ICD-10-CM | POA: Diagnosis not present

## 2015-01-24 LAB — LIPID PANEL
CHOL/HDL RATIO: 2.6 ratio
Cholesterol: 90 mg/dL (ref 0–200)
HDL: 34 mg/dL — AB (ref >39)
LDL Cholesterol: 47 mg/dL (ref 0–99)
TRIGLYCERIDES: 47 mg/dL (ref <150)
VLDL: 9 mg/dL (ref 0–40)

## 2015-01-24 LAB — HEPATIC FUNCTION PANEL
ALBUMIN: 4.3 g/dL (ref 3.5–5.2)
ALT: 20 U/L (ref 0–53)
AST: 16 U/L (ref 0–37)
Alkaline Phosphatase: 79 U/L (ref 39–117)
Bilirubin, Direct: 0.2 mg/dL (ref 0.0–0.3)
Indirect Bilirubin: 0.4 mg/dL (ref 0.2–1.2)
TOTAL PROTEIN: 6.8 g/dL (ref 6.0–8.3)
Total Bilirubin: 0.6 mg/dL (ref 0.2–1.2)

## 2015-01-25 ENCOUNTER — Encounter (HOSPITAL_COMMUNITY)
Admission: RE | Admit: 2015-01-25 | Discharge: 2015-01-25 | Disposition: A | Discharge status: Home / Self Care | Payer: PRIVATE HEALTH INSURANCE | Source: Ambulatory Visit | Attending: Cardiovascular Disease | Admitting: Cardiovascular Disease

## 2015-01-25 DIAGNOSIS — Z5189 Encounter for other specified aftercare: Secondary | ICD-10-CM | POA: Diagnosis not present

## 2015-01-28 ENCOUNTER — Encounter (HOSPITAL_COMMUNITY): Payer: PRIVATE HEALTH INSURANCE

## 2015-01-30 ENCOUNTER — Encounter (HOSPITAL_COMMUNITY)
Admission: RE | Admit: 2015-01-30 | Discharge: 2015-01-30 | Disposition: A | Discharge status: Home / Self Care | Payer: PRIVATE HEALTH INSURANCE | Source: Ambulatory Visit | Attending: Cardiovascular Disease | Admitting: Cardiovascular Disease

## 2015-01-30 ENCOUNTER — Encounter: Payer: Self-pay | Admitting: *Deleted

## 2015-01-30 DIAGNOSIS — Z5189 Encounter for other specified aftercare: Secondary | ICD-10-CM | POA: Diagnosis not present

## 2015-02-01 ENCOUNTER — Encounter (HOSPITAL_COMMUNITY)
Admission: RE | Admit: 2015-02-01 | Discharge: 2015-02-01 | Disposition: A | Discharge status: Home / Self Care | Payer: PRIVATE HEALTH INSURANCE | Source: Ambulatory Visit | Attending: Cardiovascular Disease | Admitting: Cardiovascular Disease

## 2015-02-01 ENCOUNTER — Encounter: Payer: Self-pay | Admitting: Cardiovascular Disease

## 2015-02-01 DIAGNOSIS — Z5189 Encounter for other specified aftercare: Secondary | ICD-10-CM | POA: Diagnosis not present

## 2015-02-04 ENCOUNTER — Encounter (HOSPITAL_COMMUNITY): Payer: PRIVATE HEALTH INSURANCE

## 2015-02-06 ENCOUNTER — Encounter (HOSPITAL_COMMUNITY)
Admission: RE | Admit: 2015-02-06 | Discharge: 2015-02-06 | Disposition: A | Discharge status: Home / Self Care | Payer: PRIVATE HEALTH INSURANCE | Source: Ambulatory Visit | Attending: Cardiovascular Disease | Admitting: Cardiovascular Disease

## 2015-02-06 DIAGNOSIS — Z5189 Encounter for other specified aftercare: Secondary | ICD-10-CM | POA: Diagnosis not present

## 2015-02-08 ENCOUNTER — Encounter (HOSPITAL_COMMUNITY)
Admission: RE | Admit: 2015-02-08 | Discharge: 2015-02-08 | Disposition: A | Discharge status: Home / Self Care | Payer: PRIVATE HEALTH INSURANCE | Source: Ambulatory Visit | Attending: Cardiovascular Disease | Admitting: Cardiovascular Disease

## 2015-02-08 ENCOUNTER — Other Ambulatory Visit: Payer: Self-pay

## 2015-02-08 DIAGNOSIS — Z5189 Encounter for other specified aftercare: Secondary | ICD-10-CM | POA: Diagnosis not present

## 2015-02-08 MED ORDER — METOPROLOL TARTRATE 25 MG PO TABS: 37.5000 mg | ORAL_TABLET | Freq: Two times a day (BID) | ORAL | Status: DC

## 2015-02-08 NOTE — Telephone Encounter (Signed)
Rx(s) sent to pharmacy electronically.  

## 2015-02-11 ENCOUNTER — Encounter (HOSPITAL_COMMUNITY)
Admission: RE | Admit: 2015-02-11 | Discharge: 2015-02-11 | Disposition: A | Discharge status: Home / Self Care | Payer: PRIVATE HEALTH INSURANCE | Source: Ambulatory Visit | Attending: Cardiovascular Disease | Admitting: Cardiovascular Disease

## 2015-02-11 DIAGNOSIS — Z5189 Encounter for other specified aftercare: Secondary | ICD-10-CM | POA: Diagnosis not present

## 2015-02-13 ENCOUNTER — Encounter (HOSPITAL_COMMUNITY)
Admission: RE | Admit: 2015-02-13 | Discharge: 2015-02-13 | Disposition: A | Discharge status: Home / Self Care | Payer: PRIVATE HEALTH INSURANCE | Source: Ambulatory Visit | Attending: Cardiovascular Disease | Admitting: Cardiovascular Disease

## 2015-02-13 DIAGNOSIS — Z794 Long term (current) use of insulin: Secondary | ICD-10-CM | POA: Diagnosis not present

## 2015-02-13 DIAGNOSIS — E785 Hyperlipidemia, unspecified: Secondary | ICD-10-CM | POA: Diagnosis not present

## 2015-02-13 DIAGNOSIS — Z955 Presence of coronary angioplasty implant and graft: Secondary | ICD-10-CM | POA: Diagnosis not present

## 2015-02-13 DIAGNOSIS — Z5189 Encounter for other specified aftercare: Secondary | ICD-10-CM | POA: Insufficient documentation

## 2015-02-13 DIAGNOSIS — E663 Overweight: Secondary | ICD-10-CM | POA: Diagnosis not present

## 2015-02-13 DIAGNOSIS — Z87891 Personal history of nicotine dependence: Secondary | ICD-10-CM | POA: Diagnosis not present

## 2015-02-13 DIAGNOSIS — M4322 Fusion of spine, cervical region: Secondary | ICD-10-CM | POA: Insufficient documentation

## 2015-02-13 DIAGNOSIS — Z79899 Other long term (current) drug therapy: Secondary | ICD-10-CM | POA: Insufficient documentation

## 2015-02-13 DIAGNOSIS — I252 Old myocardial infarction: Secondary | ICD-10-CM | POA: Insufficient documentation

## 2015-02-13 DIAGNOSIS — E119 Type 2 diabetes mellitus without complications: Secondary | ICD-10-CM | POA: Insufficient documentation

## 2015-02-13 DIAGNOSIS — I10 Essential (primary) hypertension: Secondary | ICD-10-CM | POA: Diagnosis not present

## 2015-02-13 DIAGNOSIS — I2510 Atherosclerotic heart disease of native coronary artery without angina pectoris: Secondary | ICD-10-CM | POA: Diagnosis not present

## 2015-02-15 ENCOUNTER — Encounter (HOSPITAL_COMMUNITY)
Admission: RE | Admit: 2015-02-15 | Discharge: 2015-02-15 | Disposition: A | Discharge status: Home / Self Care | Payer: PRIVATE HEALTH INSURANCE | Source: Ambulatory Visit | Attending: Cardiovascular Disease | Admitting: Cardiovascular Disease

## 2015-02-15 DIAGNOSIS — Z5189 Encounter for other specified aftercare: Secondary | ICD-10-CM | POA: Diagnosis not present

## 2015-02-18 ENCOUNTER — Encounter (HOSPITAL_COMMUNITY)
Admission: RE | Admit: 2015-02-18 | Discharge: 2015-02-18 | Disposition: A | Discharge status: Home / Self Care | Payer: PRIVATE HEALTH INSURANCE | Source: Ambulatory Visit | Attending: Cardiovascular Disease | Admitting: Cardiovascular Disease

## 2015-02-18 DIAGNOSIS — Z5189 Encounter for other specified aftercare: Secondary | ICD-10-CM | POA: Diagnosis not present

## 2015-02-20 ENCOUNTER — Encounter (HOSPITAL_COMMUNITY)
Admission: RE | Admit: 2015-02-20 | Discharge: 2015-02-20 | Disposition: A | Discharge status: Home / Self Care | Payer: PRIVATE HEALTH INSURANCE | Source: Ambulatory Visit | Attending: Cardiovascular Disease | Admitting: Cardiovascular Disease

## 2015-02-20 DIAGNOSIS — Z5189 Encounter for other specified aftercare: Secondary | ICD-10-CM | POA: Diagnosis not present

## 2015-02-22 ENCOUNTER — Encounter (HOSPITAL_COMMUNITY): Payer: PRIVATE HEALTH INSURANCE

## 2015-02-25 ENCOUNTER — Telehealth (HOSPITAL_COMMUNITY): Payer: Self-pay

## 2015-02-25 ENCOUNTER — Encounter (HOSPITAL_COMMUNITY)
Admission: RE | Admit: 2015-02-25 | Discharge: 2015-02-25 | Disposition: A | Discharge status: Home / Self Care | Payer: PRIVATE HEALTH INSURANCE | Source: Ambulatory Visit | Attending: Cardiovascular Disease | Admitting: Cardiovascular Disease

## 2015-02-25 DIAGNOSIS — Z5189 Encounter for other specified aftercare: Secondary | ICD-10-CM | POA: Diagnosis not present

## 2015-02-25 NOTE — Progress Notes (Signed)
[name] [name] 54 y.o. male Nutrition Note Spoke with pt. Nutrition Plan and Nutrition Survey goals reviewed with pt. Pt is following Step 2 of the Therapeutic Lifestyle Changes diet. Per discussion, pt states he prepares almost all food at home and has his own garden. Pt wants to lose wt. Pt has been trying to lose wt by decreasing portion sizes consumed. Pt is diabetic. Last A1c indicates blood glucose not optimally controlled. Pt checks fasting CBG's daily, which reportedly range from 100-120 mg/dL. This Probation officer went over Diabetes Education test results. Pt expressed understanding of the information reviewed. Pt aware of nutrition education classes offered and is unable to attend nutrition classes.  Nutrition Diagnosis ? Food-and nutrition-related knowledge deficit related to lack of exposure to information as related to diagnosis of: ? CVD ? DM (A1c 8.3) ? Obesity related to excessive energy intake as evidenced by a BMI of 36.2  Nutrition RX/ Estimated Daily Nutrition Needs for: wt loss 1900-2400 Kcal, 50-65 gm fat, 12-16 gm sat fat, 1.9-2.4 gm trans-fat, <1500 mg sodium, 250 gm CHO   Nutrition Intervention ? Pt's individual nutrition plan reviewed with pt. ? Benefits of adopting Therapeutic Lifestyle Changes discussed when Medficts reviewed. ? Pt to attend the Portion Distortion class - met; 01/16/15 and 02/13/15 ? Pt to attend the Diabetes Q& A class - met; 01/25/15 ? Pt given handouts for: ? Nutrition I class ? Nutrition II class ? Diabetes Blitz class ? Continue client-centered nutrition education by RD, as part of interdisciplinary care. Goal(s) ? Pt to identify food quantities necessary to achieve: ? wt loss to a goal wt of 238-256 lb (108.5-116.7 kg) at graduation from cardiac rehab.  ? CBG concentrations in the normal range or as close to normal as is safely possible. Monitor and Evaluate progress toward nutrition goal with team. Nutrition Risk: Change to Moderate [name],  M.Ed, RD, LDN, CDE 02/25/2015 9:21 AM

## 2015-02-27 ENCOUNTER — Encounter (HOSPITAL_COMMUNITY)
Admission: RE | Admit: 2015-02-27 | Discharge: 2015-02-27 | Disposition: A | Discharge status: Home / Self Care | Payer: PRIVATE HEALTH INSURANCE | Source: Ambulatory Visit | Attending: Cardiovascular Disease | Admitting: Cardiovascular Disease

## 2015-02-27 DIAGNOSIS — Z5189 Encounter for other specified aftercare: Secondary | ICD-10-CM | POA: Diagnosis not present

## 2015-03-01 ENCOUNTER — Encounter (HOSPITAL_COMMUNITY)
Admission: RE | Admit: 2015-03-01 | Discharge: 2015-03-01 | Disposition: A | Discharge status: Home / Self Care | Payer: PRIVATE HEALTH INSURANCE | Source: Ambulatory Visit | Attending: Cardiovascular Disease | Admitting: Cardiovascular Disease

## 2015-03-01 DIAGNOSIS — Z5189 Encounter for other specified aftercare: Secondary | ICD-10-CM | POA: Diagnosis not present

## 2015-03-04 ENCOUNTER — Encounter (HOSPITAL_COMMUNITY)
Admission: RE | Admit: 2015-03-04 | Discharge: 2015-03-04 | Disposition: A | Discharge status: Home / Self Care | Payer: PRIVATE HEALTH INSURANCE | Source: Ambulatory Visit | Attending: Cardiovascular Disease | Admitting: Cardiovascular Disease

## 2015-03-04 DIAGNOSIS — Z5189 Encounter for other specified aftercare: Secondary | ICD-10-CM | POA: Diagnosis not present

## 2015-03-06 ENCOUNTER — Encounter (HOSPITAL_COMMUNITY)
Admission: RE | Admit: 2015-03-06 | Discharge: 2015-03-06 | Disposition: A | Discharge status: Home / Self Care | Payer: PRIVATE HEALTH INSURANCE | Source: Ambulatory Visit | Attending: Cardiovascular Disease | Admitting: Cardiovascular Disease

## 2015-03-06 DIAGNOSIS — Z5189 Encounter for other specified aftercare: Secondary | ICD-10-CM | POA: Diagnosis not present

## 2015-03-08 ENCOUNTER — Encounter (HOSPITAL_COMMUNITY): Payer: PRIVATE HEALTH INSURANCE

## 2015-03-11 ENCOUNTER — Encounter (HOSPITAL_COMMUNITY): Payer: PRIVATE HEALTH INSURANCE

## 2015-03-13 ENCOUNTER — Encounter (HOSPITAL_COMMUNITY): Payer: PRIVATE HEALTH INSURANCE

## 2015-03-15 ENCOUNTER — Encounter (HOSPITAL_COMMUNITY): Payer: PRIVATE HEALTH INSURANCE

## 2015-03-18 ENCOUNTER — Encounter (HOSPITAL_COMMUNITY)
Admission: RE | Admit: 2015-03-18 | Discharge: 2015-03-18 | Disposition: A | Discharge status: Home / Self Care | Payer: PRIVATE HEALTH INSURANCE | Source: Ambulatory Visit | Attending: Cardiovascular Disease | Admitting: Cardiovascular Disease

## 2015-03-18 DIAGNOSIS — M4322 Fusion of spine, cervical region: Secondary | ICD-10-CM | POA: Diagnosis not present

## 2015-03-18 DIAGNOSIS — I252 Old myocardial infarction: Secondary | ICD-10-CM | POA: Diagnosis not present

## 2015-03-18 DIAGNOSIS — Z79899 Other long term (current) drug therapy: Secondary | ICD-10-CM | POA: Insufficient documentation

## 2015-03-18 DIAGNOSIS — Z5189 Encounter for other specified aftercare: Secondary | ICD-10-CM | POA: Diagnosis not present

## 2015-03-18 DIAGNOSIS — E785 Hyperlipidemia, unspecified: Secondary | ICD-10-CM | POA: Insufficient documentation

## 2015-03-18 DIAGNOSIS — Z955 Presence of coronary angioplasty implant and graft: Secondary | ICD-10-CM | POA: Insufficient documentation

## 2015-03-18 DIAGNOSIS — I10 Essential (primary) hypertension: Secondary | ICD-10-CM | POA: Diagnosis not present

## 2015-03-18 DIAGNOSIS — E119 Type 2 diabetes mellitus without complications: Secondary | ICD-10-CM | POA: Diagnosis not present

## 2015-03-18 DIAGNOSIS — E663 Overweight: Secondary | ICD-10-CM | POA: Diagnosis not present

## 2015-03-18 DIAGNOSIS — I2510 Atherosclerotic heart disease of native coronary artery without angina pectoris: Secondary | ICD-10-CM | POA: Insufficient documentation

## 2015-03-18 DIAGNOSIS — Z87891 Personal history of nicotine dependence: Secondary | ICD-10-CM | POA: Insufficient documentation

## 2015-03-18 DIAGNOSIS — Z794 Long term (current) use of insulin: Secondary | ICD-10-CM | POA: Insufficient documentation

## 2015-03-20 ENCOUNTER — Encounter (HOSPITAL_COMMUNITY)
Admission: RE | Admit: 2015-03-20 | Discharge: 2015-03-20 | Disposition: A | Discharge status: Home / Self Care | Payer: PRIVATE HEALTH INSURANCE | Source: Ambulatory Visit | Attending: Cardiovascular Disease | Admitting: Cardiovascular Disease

## 2015-03-20 DIAGNOSIS — Z5189 Encounter for other specified aftercare: Secondary | ICD-10-CM | POA: Diagnosis not present

## 2015-03-22 ENCOUNTER — Encounter (HOSPITAL_COMMUNITY)
Admission: RE | Admit: 2015-03-22 | Discharge: 2015-03-22 | Disposition: A | Discharge status: Home / Self Care | Payer: PRIVATE HEALTH INSURANCE | Source: Ambulatory Visit | Attending: Cardiovascular Disease | Admitting: Cardiovascular Disease

## 2015-03-22 DIAGNOSIS — Z5189 Encounter for other specified aftercare: Secondary | ICD-10-CM | POA: Diagnosis not present

## 2015-03-25 ENCOUNTER — Encounter (HOSPITAL_COMMUNITY)
Admission: RE | Admit: 2015-03-25 | Discharge: 2015-03-25 | Disposition: A | Discharge status: Home / Self Care | Payer: PRIVATE HEALTH INSURANCE | Source: Ambulatory Visit | Attending: Cardiovascular Disease | Admitting: Cardiovascular Disease

## 2015-03-25 DIAGNOSIS — Z5189 Encounter for other specified aftercare: Secondary | ICD-10-CM | POA: Diagnosis not present

## 2015-03-27 ENCOUNTER — Encounter (HOSPITAL_COMMUNITY): Payer: PRIVATE HEALTH INSURANCE

## 2015-03-29 ENCOUNTER — Telehealth (HOSPITAL_COMMUNITY): Payer: Self-pay | Admitting: Family Medicine

## 2015-03-29 ENCOUNTER — Encounter (HOSPITAL_COMMUNITY): Payer: PRIVATE HEALTH INSURANCE

## 2015-04-01 ENCOUNTER — Encounter (HOSPITAL_COMMUNITY)
Admission: RE | Admit: 2015-04-01 | Discharge: 2015-04-01 | Disposition: A | Discharge status: Home / Self Care | Payer: PRIVATE HEALTH INSURANCE | Source: Ambulatory Visit | Attending: Cardiovascular Disease | Admitting: Cardiovascular Disease

## 2015-04-01 DIAGNOSIS — Z5189 Encounter for other specified aftercare: Secondary | ICD-10-CM | POA: Diagnosis not present

## 2015-04-03 ENCOUNTER — Encounter (HOSPITAL_COMMUNITY)
Admission: RE | Admit: 2015-04-03 | Discharge: 2015-04-03 | Disposition: A | Discharge status: Home / Self Care | Payer: PRIVATE HEALTH INSURANCE | Source: Ambulatory Visit | Attending: Cardiovascular Disease | Admitting: Cardiovascular Disease

## 2015-04-03 DIAGNOSIS — Z5189 Encounter for other specified aftercare: Secondary | ICD-10-CM | POA: Diagnosis not present

## 2015-04-05 ENCOUNTER — Encounter (HOSPITAL_COMMUNITY)
Admission: RE | Admit: 2015-04-05 | Discharge: 2015-04-05 | Disposition: A | Discharge status: Home / Self Care | Payer: PRIVATE HEALTH INSURANCE | Source: Ambulatory Visit | Attending: Cardiovascular Disease | Admitting: Cardiovascular Disease

## 2015-04-05 DIAGNOSIS — Z5189 Encounter for other specified aftercare: Secondary | ICD-10-CM | POA: Diagnosis not present

## 2015-04-08 ENCOUNTER — Encounter (HOSPITAL_COMMUNITY)
Admission: RE | Admit: 2015-04-08 | Discharge: 2015-04-08 | Disposition: A | Discharge status: Home / Self Care | Payer: PRIVATE HEALTH INSURANCE | Source: Ambulatory Visit | Attending: Cardiovascular Disease | Admitting: Cardiovascular Disease

## 2015-04-08 DIAGNOSIS — Z5189 Encounter for other specified aftercare: Secondary | ICD-10-CM | POA: Diagnosis not present

## 2015-04-10 ENCOUNTER — Encounter (HOSPITAL_COMMUNITY)
Admission: RE | Admit: 2015-04-10 | Discharge: 2015-04-10 | Disposition: A | Discharge status: Home / Self Care | Payer: PRIVATE HEALTH INSURANCE | Source: Ambulatory Visit | Attending: Cardiovascular Disease | Admitting: Cardiovascular Disease

## 2015-04-10 DIAGNOSIS — Z5189 Encounter for other specified aftercare: Secondary | ICD-10-CM | POA: Diagnosis not present

## 2015-04-12 ENCOUNTER — Encounter (HOSPITAL_COMMUNITY): Payer: PRIVATE HEALTH INSURANCE

## 2015-04-15 ENCOUNTER — Encounter (HOSPITAL_COMMUNITY): Admission: RE | Admit: 2015-04-15 | Payer: PRIVATE HEALTH INSURANCE | Source: Ambulatory Visit

## 2015-04-17 ENCOUNTER — Encounter (HOSPITAL_COMMUNITY)
Admission: RE | Admit: 2015-04-17 | Discharge: 2015-04-17 | Disposition: A | Discharge status: Home / Self Care | Payer: PRIVATE HEALTH INSURANCE | Source: Ambulatory Visit | Attending: Cardiovascular Disease | Admitting: Cardiovascular Disease

## 2015-04-17 DIAGNOSIS — I252 Old myocardial infarction: Secondary | ICD-10-CM | POA: Insufficient documentation

## 2015-04-17 DIAGNOSIS — I10 Essential (primary) hypertension: Secondary | ICD-10-CM | POA: Diagnosis not present

## 2015-04-17 DIAGNOSIS — Z87891 Personal history of nicotine dependence: Secondary | ICD-10-CM | POA: Diagnosis not present

## 2015-04-17 DIAGNOSIS — E119 Type 2 diabetes mellitus without complications: Secondary | ICD-10-CM | POA: Diagnosis not present

## 2015-04-17 DIAGNOSIS — M4322 Fusion of spine, cervical region: Secondary | ICD-10-CM | POA: Diagnosis not present

## 2015-04-17 DIAGNOSIS — Z79899 Other long term (current) drug therapy: Secondary | ICD-10-CM | POA: Insufficient documentation

## 2015-04-17 DIAGNOSIS — Z794 Long term (current) use of insulin: Secondary | ICD-10-CM | POA: Insufficient documentation

## 2015-04-17 DIAGNOSIS — Z955 Presence of coronary angioplasty implant and graft: Secondary | ICD-10-CM | POA: Diagnosis not present

## 2015-04-17 DIAGNOSIS — I2510 Atherosclerotic heart disease of native coronary artery without angina pectoris: Secondary | ICD-10-CM | POA: Insufficient documentation

## 2015-04-17 DIAGNOSIS — Z5189 Encounter for other specified aftercare: Secondary | ICD-10-CM | POA: Insufficient documentation

## 2015-04-17 DIAGNOSIS — E785 Hyperlipidemia, unspecified: Secondary | ICD-10-CM | POA: Insufficient documentation

## 2015-04-17 DIAGNOSIS — E663 Overweight: Secondary | ICD-10-CM | POA: Insufficient documentation

## 2015-04-19 ENCOUNTER — Encounter (HOSPITAL_COMMUNITY)
Admission: RE | Admit: 2015-04-19 | Discharge: 2015-04-19 | Disposition: A | Discharge status: Home / Self Care | Payer: PRIVATE HEALTH INSURANCE | Source: Ambulatory Visit | Attending: Cardiovascular Disease | Admitting: Cardiovascular Disease

## 2015-04-19 DIAGNOSIS — Z5189 Encounter for other specified aftercare: Secondary | ICD-10-CM | POA: Diagnosis not present

## 2015-04-22 ENCOUNTER — Encounter (HOSPITAL_COMMUNITY)
Admission: RE | Admit: 2015-04-22 | Discharge: 2015-04-22 | Disposition: A | Discharge status: Home / Self Care | Payer: PRIVATE HEALTH INSURANCE | Source: Ambulatory Visit | Attending: Cardiovascular Disease | Admitting: Cardiovascular Disease

## 2015-04-22 DIAGNOSIS — Z5189 Encounter for other specified aftercare: Secondary | ICD-10-CM | POA: Diagnosis not present

## 2015-04-24 ENCOUNTER — Encounter (HOSPITAL_COMMUNITY)
Admission: RE | Admit: 2015-04-24 | Discharge: 2015-04-24 | Disposition: A | Discharge status: Home / Self Care | Payer: PRIVATE HEALTH INSURANCE | Source: Ambulatory Visit | Attending: Cardiovascular Disease | Admitting: Cardiovascular Disease

## 2015-04-24 DIAGNOSIS — Z5189 Encounter for other specified aftercare: Secondary | ICD-10-CM | POA: Diagnosis not present

## 2015-04-26 ENCOUNTER — Encounter (HOSPITAL_COMMUNITY)
Admission: RE | Admit: 2015-04-26 | Discharge: 2015-04-26 | Disposition: A | Discharge status: Home / Self Care | Payer: PRIVATE HEALTH INSURANCE | Source: Ambulatory Visit | Attending: Cardiovascular Disease | Admitting: Cardiovascular Disease

## 2015-04-26 ENCOUNTER — Encounter (HOSPITAL_COMMUNITY): Payer: Self-pay

## 2015-04-26 DIAGNOSIS — Z5189 Encounter for other specified aftercare: Secondary | ICD-10-CM | POA: Diagnosis not present

## 2015-04-26 NOTE — Progress Notes (Signed)
Pt graduated from cardiac rehab program today with completion of 36 exercise sessions in Phase II. Pt maintained good attendance and progressed nicely during his participation in rehab as evidenced by increased MET level.   Medication list reconciled. Repeat  PHQ score-0.  No psychosocial needs identified, no intervention necessary.   Pt has made significant lifestyle changes and should be commended for his success. Pt feels he has achieved his goals during cardiac rehab, specifically the discipline to develop exercise routine and learning lifestyle modifications to reduce risk factors. Pt understands the impact family history of CAD has on his health and he is determined to decrease his risk of progressive disease.   Pt plans to continue exercising on his own at home gym.

## 2015-04-29 ENCOUNTER — Encounter (HOSPITAL_COMMUNITY): Payer: PRIVATE HEALTH INSURANCE

## 2015-05-01 ENCOUNTER — Encounter (HOSPITAL_COMMUNITY): Payer: PRIVATE HEALTH INSURANCE

## 2015-05-03 ENCOUNTER — Encounter (HOSPITAL_COMMUNITY): Payer: PRIVATE HEALTH INSURANCE

## 2015-05-06 ENCOUNTER — Encounter (HOSPITAL_COMMUNITY): Payer: PRIVATE HEALTH INSURANCE

## 2015-05-08 ENCOUNTER — Encounter (HOSPITAL_COMMUNITY): Payer: PRIVATE HEALTH INSURANCE

## 2015-05-10 ENCOUNTER — Encounter (HOSPITAL_COMMUNITY): Payer: PRIVATE HEALTH INSURANCE

## 2015-05-27 ENCOUNTER — Encounter: Payer: Self-pay | Admitting: Cardiovascular Disease

## 2015-06-05 ENCOUNTER — Other Ambulatory Visit: Payer: Self-pay | Admitting: Cardiovascular Disease

## 2015-06-20 ENCOUNTER — Other Ambulatory Visit: Payer: Self-pay | Admitting: Cardiology

## 2015-06-20 NOTE — Telephone Encounter (Signed)
Rx(s) sent to pharmacy electronically.  

## 2015-08-28 ENCOUNTER — Encounter: Payer: Self-pay | Admitting: Cardiovascular Disease

## 2015-08-28 ENCOUNTER — Ambulatory Visit (INDEPENDENT_AMBULATORY_CARE_PROVIDER_SITE_OTHER): Payer: PRIVATE HEALTH INSURANCE | Admitting: Cardiovascular Disease

## 2015-08-28 VITALS — BP 146/84 | HR 62 | Ht 72.0 in | Wt 263.0 lb

## 2015-08-28 DIAGNOSIS — E785 Hyperlipidemia, unspecified: Secondary | ICD-10-CM | POA: Diagnosis not present

## 2015-08-28 DIAGNOSIS — Z95820 Peripheral vascular angioplasty status with implants and grafts: Secondary | ICD-10-CM

## 2015-08-28 DIAGNOSIS — I2510 Atherosclerotic heart disease of native coronary artery without angina pectoris: Secondary | ICD-10-CM | POA: Diagnosis not present

## 2015-08-28 DIAGNOSIS — I10 Essential (primary) hypertension: Secondary | ICD-10-CM | POA: Diagnosis not present

## 2015-08-28 DIAGNOSIS — Z9889 Other specified postprocedural states: Secondary | ICD-10-CM

## 2015-08-28 NOTE — Progress Notes (Signed)
08/28/2015 [name] [name]   [birth year]  [id number]  Primary Physician [name] Rua, MD Primary Cardiologist: [name] Gess MD [name] [name]   HPI:  Mr. [name] [name] is a 54 year old moderately overweight. Caucasian male father of one son who goes to [employer] who works in [employer]. His primary care physician is Dr. Silvestre [name] [name] [name]. I last saw him in the office 01/15/15.Marland KitchenHe has a history of hypertension, diabetes, hyperlipidemia. He also has a strong family history of heart disease with father that had an MI at age 15 and a brother who died at age 39 with a history of coronary artery disease. He's had chest pain over the last several weeks and was seen by his primary care physician on 11/14/14 with PPIs. At that time his EKG showed no acute changes. He had fairly constant chest pain most the night last night and came to the emergency room today. His troponin was greater than 6 and his EKG shows inferolateral T-wave inversion. He is being admitted for a non-STEMI 11/23/14. On 11/26/14 he underwent cardiac catheterization performed by myself radially revealing a 90% mid LAD lesion which I stented using a drug-eluting stent. He had moderate ramus branch disease and normal LV function. He is on Brilenta, dual antiplatelet therapy. He denies chest pain or shortness of breath did  Participate in the cardiac rehabilitation program.   Current Outpatient Prescriptions  Medication Sig Dispense Refill  . acetaminophen (TYLENOL) 325 MG tablet Take 2 tablets (650 mg total) by mouth every 4 (four) hours as needed for headache or mild pain.    [name] aspirin 81 MG chewable tablet Chew 1 tablet (81 mg total) by mouth daily.    [name] atorvastatin (LIPITOR) 80 MG tablet Take 1 tablet by mouth  (  total) daily at  6:00pm 90 tablet 0  . insulin glargine (LANTUS) 100 UNIT/ML injection Inject 45 Units into the skin every morning.    . metFORMIN (GLUCOPHAGE) 1000 MG tablet Take 1  tablet (1,000 mg total) by mouth 2 (two) times daily with a meal.    . metoprolol tartrate (LOPRESSOR) 25 MG tablet Take 1.5 tablets (37.5 mg total) by mouth 2 (two) times daily. 270 tablet 3  . nitroGLYCERIN (NITROSTAT) 0.4 MG SL tablet Place 1 tablet (0.4 mg total) under the tongue every 5 (five) minutes x 3 doses as needed for chest pain. 25 tablet 6  . pioglitazone (ACTOS) 45 MG tablet Take 45 mg by mouth daily.    . ticagrelor (BRILINTA) 90 MG TABS tablet Take 90 mg by mouth 2 (two) times daily.    . valsartan (DIOVAN) 160 MG tablet Take 160 mg by mouth daily.     No current facility-administered medications for this visit.    No Known Allergies  Social History   Social History  . Marital Status: Unknown    Spouse Name: N/A  . Number of Children: N/A  . Years of Education: N/A   Occupational History  . Not on file.   Social History Main Topics  . Smoking status: Former Games developer  . Smokeless tobacco: Not on file  . Alcohol Use: Yes     Comment: occasional  . Drug Use: No  . Sexual Activity: Not on file   Other Topics Concern  . Not on file   Social History Narrative     Review of Systems: General: negative for chills, fever, night sweats or weight changes.  Cardiovascular: negative for chest pain, dyspnea on exertion, edema, orthopnea,  palpitations, paroxysmal nocturnal dyspnea or shortness of breath Dermatological: negative for rash Respiratory: negative for cough or wheezing Urologic: negative for hematuria Abdominal: negative for nausea, vomiting, diarrhea, bright red blood per rectum, melena, or hematemesis Neurologic: negative for visual changes, syncope, or dizziness All other systems reviewed and are otherwise negative except as noted above.    Blood pressure 146/84, pulse 62, height 6' (1.829 m), weight 263 lb (119.296 kg).  General appearance: alert and no distress Neck: no adenopathy, no carotid bruit, no JVD, supple, symmetrical, trachea midline and  thyroid not enlarged, symmetric, no tenderness/mass/nodules Lungs: clear to auscultation bilaterally Heart: regular rate and rhythm, S1, S2 normal, no murmur, click, rub or gallop Extremities: extremities normal, atraumatic, no cyanosis or edema Pulses: 2+ and symmetric Skin: Skin color, texture, turgor normal. No rashes or lesions Neurologic: Grossly normal  EKG normal sinus rhythm at 62 with a ST or T-wave changes. I personally reviewed this EKG  ASSESSMENT AND PLAN:   S/P angioplasty with stent mLAD 11/26/14 History of non-STEMI 11/23/14. Performed cardiac catheterization on him 11/26/14 via the right radial approach revealing a 90% mid LAD lesion which I stented using a drug eluting stent. He had moderate ramus disease as well as normal LV function. He remains on dual therapy on Brilenta. He is asymptomatic. I'm going to transition him to Plavix in January and obtain a Verify Now P2 Y 12 test.  Hyperlipidemia with target LDL less than 70 History of hyperlipidemia for study 80 mg a day with recent lipid profile performed 01/24/15 revealed a total cholesterol of 90, LDL 47 and HDL of 34  Essential hypertension History of hypertension blood pressure measured today at 146/84. He is on metoprolol and Diovan. Continue current meds at current dosing      [name] Gess MD [name] [name], [name] [name] 08/28/2015 11:50 AM

## 2015-08-28 NOTE — Patient Instructions (Signed)
  We will see you back in follow up in 1 year with Dr Allyson Sabal.   Dr Allyson Sabal has ordered: Continue Brilinta through December.  When you fill your last bottle of Brilinta give [place] a call and we will send in the prescription for Plavix. After you have been on the Plavix for 2 weeks, we need to check some blood work to see if you are a Plavix responder.  Our office number is [phone]; you can ask to leave a message for Kim.

## 2015-08-28 NOTE — Assessment & Plan Note (Signed)
History of hypertension blood pressure measured today at 146/84. He is on metoprolol and Diovan. Continue current meds at current dosing

## 2015-08-28 NOTE — Assessment & Plan Note (Signed)
History of non-STEMI 11/23/14. Performed cardiac catheterization on him 11/26/14 via the right radial approach revealing a 90% mid LAD lesion which I stented using a drug eluting stent. He had moderate ramus disease as well as normal LV function. He remains on dual therapy on Brilenta. He is asymptomatic. I'm going to transition him to Plavix in January and obtain a Verify Now P2 Y 12 test.

## 2015-08-28 NOTE — Assessment & Plan Note (Signed)
History of hyperlipidemia for study 80 mg a day with recent lipid profile performed 01/24/15 revealed a total cholesterol of 90, LDL 47 and HDL of 34

## 2015-09-03 ENCOUNTER — Other Ambulatory Visit: Payer: Self-pay | Admitting: Cardiovascular Disease

## 2015-09-03 NOTE — Telephone Encounter (Signed)
Rx request sent to pharmacy.  

## 2015-11-11 ENCOUNTER — Other Ambulatory Visit: Payer: Self-pay | Admitting: Cardiovascular Disease

## 2015-11-11 DIAGNOSIS — Z5181 Encounter for therapeutic drug level monitoring: Secondary | ICD-10-CM

## 2015-11-11 NOTE — Telephone Encounter (Signed)
°*  STAT* If patient is at the pharmacy, call can be transferred to refill team.   1. Which medications need to be refilled? (please list name of each medication and dose if known) Generic Plavix-new prescription  2. Which pharmacy/location (including street and city if local pharmacy) is medication to be sent to?[facility]  3. Do they need a 30 day or 90 day supply? 90 and refills

## 2015-11-13 ENCOUNTER — Other Ambulatory Visit: Payer: Self-pay

## 2015-11-13 DIAGNOSIS — Z5181 Encounter for therapeutic drug level monitoring: Secondary | ICD-10-CM

## 2015-11-13 MED ORDER — CLOPIDOGREL BISULFATE 75 MG PO TABS: 75.0000 mg | ORAL_TABLET | Freq: Every day | ORAL | Status: DC

## 2015-11-13 NOTE — Telephone Encounter (Signed)
Patient notified

## 2015-11-13 NOTE — Addendum Note (Signed)
Addended by: Neta Ehlers on: 11/13/2015 11:12 AM   Modules accepted: Orders

## 2015-11-13 NOTE — Telephone Encounter (Signed)
Rx(s) sent to pharmacy electronically.  Patient needs to have blood work in 2 weeks after starting Plavix to make sure that he will be a Plavix responder.

## 2015-12-04 ENCOUNTER — Telehealth: Payer: Self-pay | Admitting: Cardiovascular Disease

## 2015-12-04 NOTE — Telephone Encounter (Signed)
Requesting surgical clearance:   1. Type of surgery: tooth extraction   2. Surgeon: Dr. Al Pimple  3. Surgical date: 01/05/2015  4. Medications that need to be held: Plavix (pt just started Plavix last week after finishing Brilinta- going to P2y12 on 12/30)  5. CAD: Yes     6. I will defer to: [facility]    Dr. Al Pimple Phone [phone] Fax: [phone]

## 2015-12-04 NOTE — Telephone Encounter (Signed)
Pt called in needing clearance before going back to the dentist( Dr. Al Pimple) to have his teeth extracted. The telephone number is [phone] and the fax is [phone].   Thanks

## 2015-12-04 NOTE — Telephone Encounter (Signed)
Referred to Dr Allyson Sabal and Gabriel Rainwater RN

## 2015-12-04 NOTE — Telephone Encounter (Signed)
OK to hold anti platelet agents for procedure 

## 2015-12-05 NOTE — Telephone Encounter (Signed)
Fax sent to Dr. Leonie Man office with clearance.

## 2015-12-13 ENCOUNTER — Other Ambulatory Visit (HOSPITAL_COMMUNITY)
Admission: AD | Admit: 2015-12-13 | Discharge: 2015-12-13 | Disposition: A | Discharge status: Home / Self Care | Payer: PRIVATE HEALTH INSURANCE | Source: Ambulatory Visit | Attending: Cardiovascular Disease | Admitting: Cardiovascular Disease

## 2015-12-13 DIAGNOSIS — Z029 Encounter for administrative examinations, unspecified: Secondary | ICD-10-CM | POA: Insufficient documentation

## 2015-12-13 LAB — PLATELET INHIBITION P2Y12: Platelet Function  P2Y12: 175 [PRU] — ABNORMAL LOW (ref 194–418)

## 2015-12-19 ENCOUNTER — Telehealth: Payer: Self-pay | Admitting: Cardiovascular Disease

## 2015-12-19 NOTE — Telephone Encounter (Signed)
Mr. [name] [name]  Is returning your call . Please call   Thanks

## 2015-12-19 NOTE — Telephone Encounter (Signed)
Pt contacted with lab results.

## 2016-09-01 IMAGING — CR DG CHEST 1V PORT
1 series · 1 of 1 positions shown · non-contrast
Comparison: None.

CLINICAL DATA: One-day history of chest pain.

EXAM:
PORTABLE CHEST - 1 VIEW

[AP]
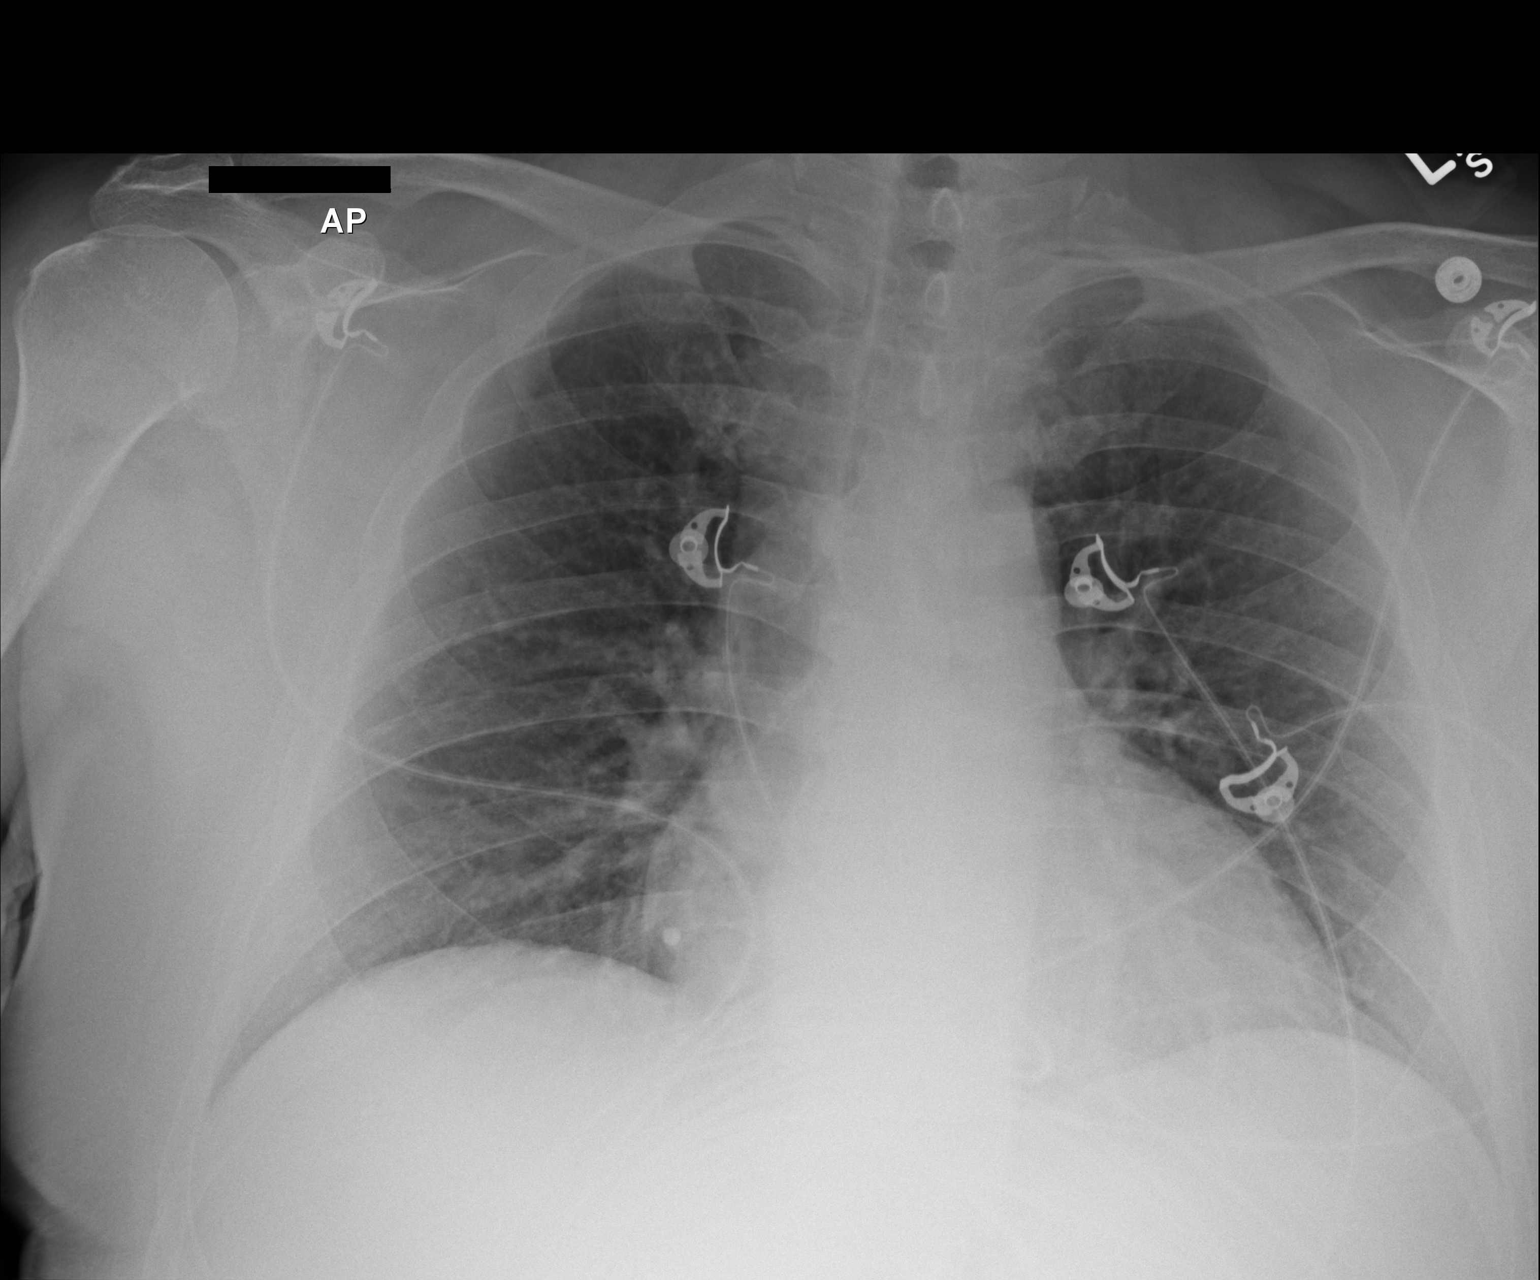

[1 of 1 positions shown; findings below may reference images not displayed]

FINDINGS: The cardiac silhouette, mediastinal and hilar contours are within
normal limits given the AP projection and portable technique. The
lungs are clear. No pleural effusion. The bony thorax is intact.
IMPRESSION: No acute cardiopulmonary findings.

## 2016-10-18 ENCOUNTER — Other Ambulatory Visit: Payer: Self-pay | Admitting: Cardiovascular Disease

## 2016-10-19 NOTE — Telephone Encounter (Signed)
Rx request sent to pharmacy.  

## 2017-01-06 ENCOUNTER — Encounter: Payer: Self-pay | Admitting: Cardiovascular Disease

## 2017-01-06 ENCOUNTER — Ambulatory Visit (INDEPENDENT_AMBULATORY_CARE_PROVIDER_SITE_OTHER): Payer: PRIVATE HEALTH INSURANCE | Admitting: Cardiovascular Disease

## 2017-01-06 VITALS — BP 152/72 | HR 87 | Ht 72.0 in | Wt 278.2 lb

## 2017-01-06 DIAGNOSIS — I10 Essential (primary) hypertension: Secondary | ICD-10-CM

## 2017-01-06 DIAGNOSIS — I2510 Atherosclerotic heart disease of native coronary artery without angina pectoris: Secondary | ICD-10-CM

## 2017-01-06 DIAGNOSIS — E785 Hyperlipidemia, unspecified: Secondary | ICD-10-CM | POA: Diagnosis not present

## 2017-01-06 NOTE — Assessment & Plan Note (Signed)
History of non-STEMI 11/23/14. Right cardiac catheterization by myself radially revealing a 90% mid LAD lesion which I stented using a drug-eluting stent. He had moderate ramus branch disease and normal LV function. He was on dual antibiotic therapy with aspirin and Brilenta ultimate transition to Plavix. He's had no recurrent anginal symptoms.

## 2017-01-06 NOTE — Patient Instructions (Signed)

## 2017-01-06 NOTE — Assessment & Plan Note (Signed)
History of hypertension blood pressure measured 152/72. He is on metoprolol and valsartan. Continue current meds at current dosing

## 2017-01-06 NOTE — Assessment & Plan Note (Signed)
History of hyperlipidemia on statin therapy with recent lipid profile performed 09/17/16 revealing total cholesterol 98, LDL 50 and HDL of 37.

## 2017-01-06 NOTE — Progress Notes (Signed)
01/06/2017 [name] [name]   03-26-1961  [id number]  Primary Physician Joycelyn Rua, MD Primary Cardiologist: Runell Gess MD [name]  HPI:  Mr. [name] [name] is a 56 year old moderately overweight. Caucasian male father of one son who goes to [employer] who works in [employer]. His primary care physician is Dr. Silvestre Moment [facility] I last saw him in the office 08/28/15.Marland KitchenHe has a history of hypertension, diabetes, hyperlipidemia. He also has a strong family history of heart disease with father that had an MI at age 21 and a brother who died at age 80 with a history of coronary artery disease. He's had chest pain over the last several weeks and was seen by his primary care physician on 11/14/14 with PPIs. At that time his EKG showed no acute changes. He had fairly constant chest pain most the night last night and came to the emergency room today. His troponin was greater than 6 and his EKG shows inferolateral T-wave inversion. He is being admitted for a non-STEMI 11/23/14. On 11/26/14 he underwent cardiac catheterization performed by myself radially revealing a 90% mid LAD lesion which I stented using a drug-eluting stent. He had moderate ramus branch disease and normal LV function.  He was on dual antiplatelet therapy with aspirin and Brilenta which has been transition to Plavix. He's gained approximately 15 pounds since I saw him last probably as a result of diet and lack of exercise..   Current Outpatient Prescriptions  Medication Sig Dispense Refill  . acetaminophen (TYLENOL) 325 MG tablet Take 2 tablets (650 mg total) by mouth every 4 (four) hours as needed for headache or mild pain.    [name] aspirin 81 MG chewable tablet Chew 1 tablet (81 mg total) by mouth daily.    [name] atorvastatin (LIPITOR) 80 MG tablet TAKE 1 TABLET BY MOUTH  DAILY AT 6PM 90 tablet 2  . clopidogrel (PLAVIX) 75 MG tablet TAKE 1 TABLET BY MOUTH  DAILY 90 tablet 3  . insulin glargine  (LANTUS) 100 UNIT/ML injection Inject 45 Units into the skin every morning.    . metFORMIN (GLUCOPHAGE) 1000 MG tablet Take 1 tablet (1,000 mg total) by mouth 2 (two) times daily with a meal.    . metoprolol tartrate (LOPRESSOR) 25 MG tablet Take 1.5 tablets (37.5 mg total) by mouth 2 (two) times daily. 270 tablet 3  . nitroGLYCERIN (NITROSTAT) 0.4 MG SL tablet Place 1 tablet (0.4 mg total) under the tongue every 5 (five) minutes x 3 doses as needed for chest pain. 25 tablet 6  . pioglitazone (ACTOS) 45 MG tablet Take 45 mg by mouth daily.    . valsartan (DIOVAN) 160 MG tablet Take 160 mg by mouth daily.     No current facility-administered medications for this visit.     No Known Allergies  Social History   Social History  . Marital status: Married    Spouse name: N/A  . Number of children: N/A  . Years of education: N/A   Occupational History  . Not on file.   Social History Main Topics  . Smoking status: Former Games developer  . Smokeless tobacco: Never Used  . Alcohol use Yes     Comment: occasional  . Drug use: No  . Sexual activity: Not on file   Other Topics Concern  . Not on file   Social History Narrative  . No narrative on file     Review of Systems: General: negative for chills, fever,  night sweats or weight changes.  Cardiovascular: negative for chest pain, dyspnea on exertion, edema, orthopnea, palpitations, paroxysmal nocturnal dyspnea or shortness of breath Dermatological: negative for rash Respiratory: negative for cough or wheezing Urologic: negative for hematuria Abdominal: negative for nausea, vomiting, diarrhea, bright red blood per rectum, melena, or hematemesis Neurologic: negative for visual changes, syncope, or dizziness All other systems reviewed and are otherwise negative except as noted above.    Blood pressure (!) 152/72, pulse 87, height 6' (1.829 m), weight 278 lb 3.2 oz (126.2 kg).  General appearance: alert and no distress Neck: no  adenopathy, no carotid bruit, no JVD, supple, symmetrical, trachea midline and thyroid not enlarged, symmetric, no tenderness/mass/nodules Lungs: clear to auscultation bilaterally Heart: regular rate and rhythm, S1, S2 normal, no murmur, click, rub or gallop Extremities: extremities normal, atraumatic, no cyanosis or edema  EKG normal sinus rhythm 87 without ST or T-wave changes. I personally reviewed this EKG  ASSESSMENT AND PLAN:   Essential hypertension History of hypertension blood pressure measured 152/72. He is on metoprolol and valsartan. Continue current meds at current dosing  Hyperlipidemia with target LDL less than 70 History of hyperlipidemia on statin therapy with recent lipid profile performed 09/17/16 revealing total cholesterol 98, LDL 50 and HDL of 37.  CAD (coronary artery disease), native coronary artery, residual LCX disease  History of non-STEMI 11/23/14. Right cardiac catheterization by myself radially revealing a 90% mid LAD lesion which I stented using a drug-eluting stent. He had moderate ramus branch disease and normal LV function. He was on dual antibiotic therapy with aspirin and Brilenta ultimate transition to Plavix. He's had no recurrent anginal symptoms.      Runell Gess MD FACP,FACC,FAHA, [facility] 01/06/2017 4:26 PM

## 2017-02-09 ENCOUNTER — Other Ambulatory Visit: Payer: Self-pay | Admitting: *Deleted

## 2017-02-09 MED ORDER — CLOPIDOGREL BISULFATE 75 MG PO TABS: 75.0000 mg | ORAL_TABLET | Freq: Every day | ORAL | 1 refills | Status: DC

## 2017-02-09 NOTE — Telephone Encounter (Signed)
Patient is currently out of town and needs a short rx because he left his medication at home.  Rx has been sent to [facility] in [place].

## 2017-10-29 ENCOUNTER — Encounter: Payer: Self-pay | Admitting: Cardiovascular Disease

## 2017-10-29 ENCOUNTER — Ambulatory Visit: Payer: PRIVATE HEALTH INSURANCE | Admitting: Cardiovascular Disease

## 2017-10-29 VITALS — BP 142/80 | HR 73 | Ht 72.0 in | Wt 276.2 lb

## 2017-10-29 DIAGNOSIS — E785 Hyperlipidemia, unspecified: Secondary | ICD-10-CM

## 2017-10-29 DIAGNOSIS — I10 Essential (primary) hypertension: Secondary | ICD-10-CM | POA: Diagnosis not present

## 2017-10-29 DIAGNOSIS — I2510 Atherosclerotic heart disease of native coronary artery without angina pectoris: Secondary | ICD-10-CM

## 2017-10-29 NOTE — Assessment & Plan Note (Signed)
History of hyperlipidemia on statin therapy with recent lipid profile performed by his PCP on 10/01/17 revealing a total cholesterol 99, LDL 51 and HDL of 37.

## 2017-10-29 NOTE — Assessment & Plan Note (Signed)
History of essential hypertension with blood pressure measured 142/80. He is on losartan and metoprolol. Continue current meds at current dosing.

## 2017-10-29 NOTE — Assessment & Plan Note (Signed)
History of CAD status post non-STEMI 11/23/14. He underwent cardiac catheterization by myself 11/26/14 radially revealing 90% mid LAD lesion which I stented using a drug eluding stent. He did have moderate ramus branch disease as well has normal LV function. He remains on dual antibiotic therapy including aspirin and Plavix. He gets occasional atypical chest pain which is brief and occurs on a monthly basis which does not sound anginal.

## 2017-10-29 NOTE — Patient Instructions (Signed)

## 2017-10-29 NOTE — Progress Notes (Signed)
10/29/2017 [name] [name]   09/12/1961  [id number]  Primary Physician [name] [name], Stephen, MD Primary Cardiologist: Runell GessJonathan J Bayyinah Dukeman MD [name] CalamityFACP, FACC, FAHA, [place]  HPI:  [name] [name] is a 56 y.o.  moderately overweight. Caucasian male father of one son who goes to [employer] who works in [employer]. His primary care physician is Dr. Silvestre MomentStephen [name] [facility]. I last saw him in the office  01/06/17.[name].He has a history of hypertension, diabetes, hyperlipidemia. He also has a strong family history of heart disease with father that had an MI at age 55 and a brother who died at age 55 with a history of coronary artery disease. He's had chest pain over the last several weeks and was seen by his primary care physician on 11/14/14 with PPIs. At that time his EKG showed no acute changes. He had fairly constant chest pain most the night last night and came to the emergency room today. His troponin was greater than 6 and his EKG shows inferolateral T-wave inversion. He is being admitted for a non-STEMI 11/23/14. On 11/26/14 he underwent cardiac catheterization performed by myself radially revealing a 90% mid LAD lesion which I stented using a drug-eluting stent. He had moderate ramus branch disease and normal LV function.  He was on dual antiplatelet therapy with aspirin and Brilenta which has been transition to Plavix. He's gained approximately 15 pounds since I saw him last probably as a result of diet and lack of exercise. Since I saw him almost a year ago he's remained stable. He gets occasional atypical chest pain which is brief and infrequent. Recent blood work performed by his PCP 10/01/17 revealed total cholesterol 99, LDL 51 and HDL of 37.     Current Meds  Medication Sig  . acetaminophen (TYLENOL) 325 MG tablet Take 2 tablets (650 mg total) by mouth every 4 (four) hours as needed for headache or mild pain.  [name]. aspirin 81 MG chewable tablet Chew 1 tablet (81 mg total) by mouth daily.   [name]. atorvastatin (LIPITOR) 80 MG tablet TAKE 1 TABLET BY MOUTH  DAILY AT 6PM  . clopidogrel (PLAVIX) 75 MG tablet Take 1 tablet (75 mg total) by mouth daily.  . Insulin Glargine (BASAGLAR KWIKPEN) 100 UNIT/ML SOPN Inject 45 Units at bedtime into the skin.  [name]. LOSARTAN POTASSIUM PO Take 1 tablet daily by mouth.  . metFORMIN (GLUCOPHAGE) 1000 MG tablet Take 1 tablet (1,000 mg total) by mouth 2 (two) times daily with a meal.  . metoprolol tartrate (LOPRESSOR) 25 MG tablet Take 1.5 tablets (37.5 mg total) by mouth 2 (two) times daily.  . nitroGLYCERIN (NITROSTAT) 0.4 MG SL tablet Place 1 tablet (0.4 mg total) under the tongue every 5 (five) minutes x 3 doses as needed for chest pain.  . pioglitazone (ACTOS) 45 MG tablet Take 45 mg by mouth daily.     No Known Allergies  Social History   Socioeconomic History  . Marital status: Married    Spouse name: Not on file  . Number of children: Not on file  . Years of education: Not on file  . Highest education level: Not on file  Social Needs  . Financial resource strain: Not on file  . Food insecurity - worry: Not on file  . Food insecurity - inability: Not on file  . Transportation needs - medical: Not on file  . Transportation needs - non-medical: Not on file  Occupational History  . Not on file  Tobacco Use  .  Smoking status: Former Games developer  . Smokeless tobacco: Never Used  Substance and Sexual Activity  . Alcohol use: Yes    Comment: occasional  . Drug use: No  . Sexual activity: Not on file  Other Topics Concern  . Not on file  Social History Narrative  . Not on file     Review of Systems: General: negative for chills, fever, night sweats or weight changes.  Cardiovascular: negative for chest pain, dyspnea on exertion, edema, orthopnea, palpitations, paroxysmal nocturnal dyspnea or shortness of breath Dermatological: negative for rash Respiratory: negative for cough or wheezing Urologic: negative for hematuria Abdominal:  negative for nausea, vomiting, diarrhea, bright red blood per rectum, melena, or hematemesis Neurologic: negative for visual changes, syncope, or dizziness All other systems reviewed and are otherwise negative except as noted above.    Blood pressure (!) 142/80, pulse 73, height 6' (1.829 m), weight 276 lb 3.2 oz (125.3 kg).  General appearance: alert and no distress Neck: no adenopathy, no carotid bruit, no JVD, supple, symmetrical, trachea midline and thyroid not enlarged, symmetric, no tenderness/mass/nodules Lungs: clear to auscultation bilaterally Heart: regular rate and rhythm, S1, S2 normal, no murmur, click, rub or gallop Extremities: extremities normal, atraumatic, no cyanosis or edema Pulses: 2+ and symmetric Skin: Skin color, texture, turgor normal. No rashes or lesions Neurologic: Alert and oriented X 3, normal strength and tone. Normal symmetric reflexes. Normal coordination and gait  EKG normal sinus rhythm at 73 without ST or T-wave changes. I personally reviewed this EKG  ASSESSMENT AND PLAN:   Essential hypertension History of essential hypertension with blood pressure measured 142/80. He is on losartan and metoprolol. Continue current meds at current dosing.  Hyperlipidemia with target LDL less than 70 History of hyperlipidemia on statin therapy with recent lipid profile performed by his PCP on 10/01/17 revealing a total cholesterol 99, LDL 51 and HDL of 37.  CAD (coronary artery disease), native coronary artery, residual LCX disease  History of CAD status post non-STEMI 11/23/14. He underwent cardiac catheterization by myself 11/26/14 radially revealing 90% mid LAD lesion which I stented using a drug eluding stent. He did have moderate ramus branch disease as well has normal LV function. He remains on dual antibiotic therapy including aspirin and Plavix. He gets occasional atypical chest pain which is brief and occurs on a monthly basis which does not sound  anginal.      Runell Gess MD [facility], [facility] 10/29/2017 8:18 AM

## 2017-11-02 ENCOUNTER — Other Ambulatory Visit: Payer: Self-pay | Admitting: Cardiovascular Disease

## 2017-11-02 NOTE — Telephone Encounter (Signed)
REFILL 

## 2018-10-22 ENCOUNTER — Other Ambulatory Visit: Payer: Self-pay | Admitting: Cardiovascular Disease

## 2018-10-24 NOTE — Telephone Encounter (Signed)
Rx(s) sent to pharmacy electronically.  

## 2018-11-01 ENCOUNTER — Ambulatory Visit: Payer: PRIVATE HEALTH INSURANCE | Admitting: Cardiovascular Disease

## 2018-11-01 ENCOUNTER — Encounter: Payer: Self-pay | Admitting: Cardiovascular Disease

## 2018-11-01 VITALS — BP 158/78 | HR 69 | Ht 72.0 in | Wt 275.0 lb

## 2018-11-01 DIAGNOSIS — E785 Hyperlipidemia, unspecified: Secondary | ICD-10-CM

## 2018-11-01 DIAGNOSIS — I10 Essential (primary) hypertension: Secondary | ICD-10-CM

## 2018-11-01 DIAGNOSIS — I214 Non-ST elevation (NSTEMI) myocardial infarction: Secondary | ICD-10-CM | POA: Diagnosis not present

## 2018-11-01 NOTE — Progress Notes (Signed)
11/01/2018 [name] [name]   01-05-1961  [id number]  Primary Physician Joycelyn Rua, MD Primary Cardiologist: Runell Gess MD [name], [place]  HPI:  [name] [name] is a 57 y.o.  moderately overweight. Caucasian male father of one son who goes to [employer] who works in [employer]. His primary care physician is Dr. Silvestre Moment [facility]. I last saw him in the office  10/29/2017.Marland KitchenHe has a history of hypertension, diabetes, hyperlipidemia. He also has a strong family history of heart disease with father that had an MI at age 33 and a brother who died at age 29 with a history of coronary artery disease. He's had chest pain over the last several weeks and was seen by his primary care physician on 11/14/14 with PPIs. At that time his EKG showed no acute changes. He had fairly constant chest pain most the night last night and came to the emergency room today. His troponin was greater than 6 and his EKG shows inferolateral T-wave inversion. He is being admitted for a non-STEMI 11/23/14. On 11/26/14 he underwent cardiac catheterization performed by myself radially revealing a 90% mid LAD lesion which I stented using a drug-eluting stent. He had moderate ramus branch disease and normal LV function.He was on dual antiplatelet therapy with aspirin and Brilenta which has been transition to Plavix. He's gained approximately 15 pounds since I saw him last probably as a result of diet and lack of exercise. Since I saw him almost a year ago he's remained stable. He gets occasional atypical chest pain which is brief and infrequent. Recent blood work performed by his PCP 10/13/2018 revealed total cholesterol 100, LDL 53 and HDL of 38.   Current Meds  Medication Sig  . aspirin 81 MG chewable tablet Chew 1 tablet (81 mg total) by mouth daily.  [name] atorvastatin (LIPITOR) 80 MG tablet TAKE 1 TABLET BY MOUTH  DAILY AT 6PM  . clopidogrel (PLAVIX) 75 MG tablet TAKE 1 TABLET BY  MOUTH EVERY DAY  . Insulin Glargine (BASAGLAR KWIKPEN) 100 UNIT/ML SOPN Inject 45 Units at bedtime into the skin.  [name] LOSARTAN POTASSIUM PO Take 1 tablet daily by mouth.  . metFORMIN (GLUCOPHAGE) 1000 MG tablet Take 1 tablet (1,000 mg total) by mouth 2 (two) times daily with a meal.  . metoprolol tartrate (LOPRESSOR) 25 MG tablet Take 1.5 tablets (37.5 mg total) by mouth 2 (two) times daily.  . pioglitazone (ACTOS) 45 MG tablet Take 45 mg by mouth daily.     No Known Allergies  Social History   Socioeconomic History  . Marital status: Married    Spouse name: Not on file  . Number of children: Not on file  . Years of education: Not on file  . Highest education level: Not on file  Occupational History  . Not on file  Social Needs  . Financial resource strain: Not on file  . Food insecurity:    Worry: Not on file    Inability: Not on file  . Transportation needs:    Medical: Not on file    Non-medical: Not on file  Tobacco Use  . Smoking status: Former Games developer  . Smokeless tobacco: Never Used  Substance and Sexual Activity  . Alcohol use: Yes    Comment: occasional  . Drug use: No  . Sexual activity: Not on file  Lifestyle  . Physical activity:    Days per week: Not on file    Minutes per session: Not on  file  . Stress: Not on file  Relationships  . Social connections:    Talks on phone: Not on file    Gets together: Not on file    Attends religious service: Not on file    Active member of club or organization: Not on file    Attends meetings of clubs or organizations: Not on file    Relationship status: Not on file  . Intimate partner violence:    Fear of current or ex partner: Not on file    Emotionally abused: Not on file    Physically abused: Not on file    Forced sexual activity: Not on file  Other Topics Concern  . Not on file  Social History Narrative  . Not on file     Review of Systems: General: negative for chills, fever, night sweats or weight  changes.  Cardiovascular: negative for chest pain, dyspnea on exertion, edema, orthopnea, palpitations, paroxysmal nocturnal dyspnea or shortness of breath Dermatological: negative for rash Respiratory: negative for cough or wheezing Urologic: negative for hematuria Abdominal: negative for nausea, vomiting, diarrhea, bright red blood per rectum, melena, or hematemesis Neurologic: negative for visual changes, syncope, or dizziness All other systems reviewed and are otherwise negative except as noted above.    Blood pressure (!) 158/78, pulse 69, height 6' (1.829 m), weight 275 lb (124.7 kg).  General appearance: alert and no distress Neck: no adenopathy, no carotid bruit, no JVD, supple, symmetrical, trachea midline and thyroid not enlarged, symmetric, no tenderness/mass/nodules Lungs: clear to auscultation bilaterally Heart: regular rate and rhythm, S1, S2 normal, no murmur, click, rub or gallop Extremities: extremities normal, atraumatic, no cyanosis or edema Pulses: 2+ and symmetric Skin: Skin color, texture, turgor normal. No rashes or lesions Neurologic: Alert and oriented X 3, normal strength and tone. Normal symmetric reflexes. Normal coordination and gait  EKG sinus rhythm at 69 without ST or T wave changes.  I personally reviewed this EKG.  ASSESSMENT AND PLAN:   Essential hypertension History of essential hypertension blood pressure measured today at 158/78.  He is on metoprolol and losartan.  Continue current meds at current dosing.  Hyperlipidemia with target LDL less than 70 History of hyperlipidemia on statin therapy with lipid profile performed 10/13/2018 revealing total cholesterol 100, LDL of 53 and HDL of 38.  Non-STEMI (non-ST elevated myocardial infarction) (HCC) History of non-STEMI 11/23/2014 with radial diagnostic cath by myself revealing 90% mid LAD lesion which I stented a drug-eluting stent.  He did have moderate ramus branch stenosis and normal LV function.   He transition from Brilinta to Plavix and remains on dual antiplatelet therapy.  He denies chest pain or shortness of breath.      Runell Gess MD FACP,FACC,FAHA, [facility] 11/01/2018 4:23 PM

## 2018-11-01 NOTE — Assessment & Plan Note (Signed)
History of essential hypertension blood pressure measured today at 158/78.  He is on metoprolol and losartan.  Continue current meds at current dosing.

## 2018-11-01 NOTE — Patient Instructions (Signed)

## 2018-11-01 NOTE — Assessment & Plan Note (Signed)
History of hyperlipidemia on statin therapy with lipid profile performed 10/13/2018 revealing total cholesterol 100, LDL of 53 and HDL of 38.

## 2018-11-01 NOTE — Assessment & Plan Note (Signed)
History of non-STEMI 11/23/2014 with radial diagnostic cath by myself revealing 90% mid LAD lesion which I stented a drug-eluting stent.  He did have moderate ramus branch stenosis and normal LV function.  He transition from Brilinta to Plavix and remains on dual antiplatelet therapy.  He denies chest pain or shortness of breath.

## 2019-01-04 ENCOUNTER — Encounter: Payer: Self-pay | Admitting: Cardiology

## 2019-01-04 ENCOUNTER — Ambulatory Visit: Payer: PRIVATE HEALTH INSURANCE | Admitting: Cardiology

## 2019-01-04 ENCOUNTER — Encounter (INDEPENDENT_AMBULATORY_CARE_PROVIDER_SITE_OTHER): Payer: Self-pay

## 2019-01-04 VITALS — BP 132/76 | HR 74 | Ht 72.0 in | Wt 276.0 lb

## 2019-01-04 DIAGNOSIS — I252 Old myocardial infarction: Secondary | ICD-10-CM | POA: Diagnosis not present

## 2019-01-04 DIAGNOSIS — E119 Type 2 diabetes mellitus without complications: Secondary | ICD-10-CM | POA: Diagnosis not present

## 2019-01-04 DIAGNOSIS — Z9861 Coronary angioplasty status: Secondary | ICD-10-CM

## 2019-01-04 DIAGNOSIS — R079 Chest pain, unspecified: Secondary | ICD-10-CM | POA: Diagnosis not present

## 2019-01-04 DIAGNOSIS — I10 Essential (primary) hypertension: Secondary | ICD-10-CM

## 2019-01-04 DIAGNOSIS — IMO0001 Reserved for inherently not codable concepts without codable children: Secondary | ICD-10-CM

## 2019-01-04 DIAGNOSIS — Z794 Long term (current) use of insulin: Secondary | ICD-10-CM

## 2019-01-04 DIAGNOSIS — E785 Hyperlipidemia, unspecified: Secondary | ICD-10-CM

## 2019-01-04 DIAGNOSIS — I2510 Atherosclerotic heart disease of native coronary artery without angina pectoris: Secondary | ICD-10-CM

## 2019-01-04 NOTE — Progress Notes (Signed)
01/04/2019 [name] [name]   06/10/1961  [id number]  Primary Physician Joycelyn Rua, MD Primary Cardiologist: Dr Allyson Sabal  HPI: Patient is a pleasant 58 year old male followed by Dr. Allyson Sabal with a history of a NSTEMI in December 2015.  At that time he underwent mid LAD intervention with a DES.  He had some moderate residual CAD in the ramus intermedius.  His LV function is normal.  Other medical problems include insulin-dependent diabetes, treated hypertension, and treated dyslipidemia.  He is done quite well since his NSTEMI.  He saw Dr. Allyson Sabal in November and was doing well.  He seen in the office today with complaints of chest pain.  Patient describes a midsternal to right chest intermittent sharp chest pain.  Not related to activity.  It is not related to movement.  Has not had associated shortness of breath or diaphoresis.  He says the pain occasionally wakes him at night.  It does not last long.  He has not used nitroglycerin for this.   Current Outpatient Medications  Medication Sig Dispense Refill  . aspirin 81 MG chewable tablet Chew 1 tablet (81 mg total) by mouth daily.    [name] atorvastatin (LIPITOR) 80 MG tablet TAKE 1 TABLET BY MOUTH  DAILY AT 6PM 90 tablet 2  . clopidogrel (PLAVIX) 75 MG tablet TAKE 1 TABLET BY MOUTH EVERY DAY 90 tablet 3  . Insulin Glargine (BASAGLAR KWIKPEN) 100 UNIT/ML SOPN Inject 45 Units at bedtime into the skin.    [name] LOSARTAN POTASSIUM PO Take 1 tablet daily by mouth.    . metFORMIN (GLUCOPHAGE) 1000 MG tablet Take 1 tablet (1,000 mg total) by mouth 2 (two) times daily with a meal.    . metoprolol tartrate (LOPRESSOR) 25 MG tablet Take 1.5 tablets (37.5 mg total) by mouth 2 (two) times daily. 270 tablet 3  . pioglitazone (ACTOS) 45 MG tablet Take 45 mg by mouth daily.     No current facility-administered medications for this visit.     No Known Allergies  Past Medical History:  Diagnosis Date  . CAD (coronary artery disease), native coronary  artery, residual RCA disease  11/27/2014  . Diabetes mellitus without complication (HCC)    type 2  . Family history of heart disease   . Hyperlipidemia   . Hypertension   . S/P angioplasty with stent mLAD 11/26/14 11/27/2014    Social History   Socioeconomic History  . Marital status: Married    Spouse name: Not on file  . Number of children: Not on file  . Years of education: Not on file  . Highest education level: Not on file  Occupational History  . Not on file  Social Needs  . Financial resource strain: Not on file  . Food insecurity:    Worry: Not on file    Inability: Not on file  . Transportation needs:    Medical: Not on file    Non-medical: Not on file  Tobacco Use  . Smoking status: Former Games developer  . Smokeless tobacco: Never Used  Substance and Sexual Activity  . Alcohol use: Yes    Comment: occasional  . Drug use: No  . Sexual activity: Not on file  Lifestyle  . Physical activity:    Days per week: Not on file    Minutes per session: Not on file  . Stress: Not on file  Relationships  . Social connections:    Talks on phone: Not on file    Gets together: Not on  file    Attends religious service: Not on file    Active member of club or organization: Not on file    Attends meetings of clubs or organizations: Not on file    Relationship status: Not on file  . Intimate partner violence:    Fear of current or ex partner: Not on file    Emotionally abused: Not on file    Physically abused: Not on file    Forced sexual activity: Not on file  Other Topics Concern  . Not on file  Social History Narrative  . Not on file     Family History  Problem Relation Age of Onset  . Heart attack Father   . Heart attack Brother      Review of Systems: General: negative for chills, fever, night sweats or weight changes.  Cardiovascular: negative for chest pain, dyspnea on exertion, edema, orthopnea, palpitations, paroxysmal nocturnal dyspnea or shortness of  breath Dermatological: negative for rash Respiratory: recent URI-cough Urologic: negative for hematuria Abdominal: negative for nausea, vomiting, diarrhea, bright red blood per rectum, melena, or hematemesis Neurologic: negative for visual changes, syncope, or dizziness All other systems reviewed and are otherwise negative except as noted above.    Blood pressure 132/76, pulse 74, height 6' (1.829 m), weight 276 lb (125.2 kg).  General appearance: alert, cooperative, no distress and moderately obese Neck: no JVD Lungs: clear to auscultation bilaterally Heart: regular rate and rhythm Extremities: no edema Skin: cool, moist Neurologic: Grossly normal  EKG NSR  ASSESSMENT AND PLAN:   CAD -S/P PCI 11/23/14 NSTEMI  S/P mLAD DES 11/26/14. RI with 60% stenosis, treat medically, EF 60%  Chest pain with moderate risk of acute coronary syndrome Mid chest pain off and on x 2 weeks  Essential hypertension Controlled- Mild LVH on echo 2015  Insulin dependent diabetes mellitus (HCC) No nephropathy, retinopathy, or neuropathy  History of NSTEMI- NSTEMI Dec 2015  Hyperlipidemia with target LDL less than 70 On statin Rx-followed by PCP   PLAN  GXT Myoview. OK to hold Lopressor the PM before and AM of.   [name] PA-C 01/04/2019 8:20 AM

## 2019-01-04 NOTE — Assessment & Plan Note (Signed)
Mid chest pain off and on x 2 weeks

## 2019-01-04 NOTE — Patient Instructions (Signed)
Medication Instructions:  Your physician recommends that you continue on your current medications as directed. Please refer to the Current Medication list given to you today. If you need a refill on your cardiac medications before your next appointment, please call your pharmacy.   Lab work: None  If you have labs (blood work) drawn today and your tests are completely normal, you will receive your results only by: [name] MyChart Message (if you have MyChart) OR . A paper copy in the mail If you have any lab test that is abnormal or we need to change your treatment, we will call you to review the results.  Testing/Procedures: Your physician has requested that you have en exercise stress myoview. For further information please visit [email or web address] Please follow instruction sheet, as given. HOLD YOUR METOPROLOL THE NIGHT BEFORE AND THE MORNING OF STRESS TEST  Follow-Up: At [facility], you and your health needs are our priority.  As part of our continuing mission to provide you with exceptional heart care, we have created designated Provider Care Teams.  These Care Teams include your primary Cardiologist (physician) and Advanced Practice Providers (APPs -  Physician Assistants and Nurse Practitioners) who all work together to provide you with the care you need, when you need it. . FOLLOW UP WILL BE PENDING THE TEST RESULTS  Any Other Special Instructions Will Be Listed Below (If Applicable).

## 2019-01-04 NOTE — Assessment & Plan Note (Signed)
11/23/14 NSTEMI  S/P mLAD DES 11/26/14. RI with 60% stenosis, treat medically, EF 60% 

## 2019-01-04 NOTE — Assessment & Plan Note (Signed)
Controlled. Mild LVH on echo 2015 

## 2019-01-04 NOTE — Assessment & Plan Note (Signed)
On statin Rx- followed by PCP 

## 2019-01-04 NOTE — Assessment & Plan Note (Signed)
NSTEMI Dec 2015

## 2019-01-04 NOTE — Assessment & Plan Note (Signed)
No nephropathy, retinopathy, or neuropathy 

## 2019-01-10 ENCOUNTER — Telehealth (HOSPITAL_COMMUNITY): Payer: Self-pay

## 2019-01-10 NOTE — Telephone Encounter (Signed)
Encounter complete. 

## 2019-01-12 ENCOUNTER — Ambulatory Visit (HOSPITAL_COMMUNITY)
Admission: RE | Admit: 2019-01-12 | Discharge: 2019-01-12 | Disposition: A | Discharge status: Home / Self Care | Payer: PRIVATE HEALTH INSURANCE | Source: Ambulatory Visit | Attending: Cardiology | Admitting: Cardiology

## 2019-01-12 DIAGNOSIS — R079 Chest pain, unspecified: Secondary | ICD-10-CM | POA: Insufficient documentation

## 2019-01-12 LAB — MYOCARDIAL PERFUSION IMAGING
Estimated workload: 10 METS
Exercise duration (min): 8 min
Exercise duration (sec): 1 s
LV dias vol: 149 mL (ref 62–150)
LV sys vol: 74 mL
MPHR: 164 {beats}/min
Peak HR: 164 {beats}/min
Percent HR: 100 %
RPE: 19
Rest HR: 87 {beats}/min
SDS: 1
SRS: 3
SSS: 4
TID: 1.12

## 2019-01-12 MED ORDER — TECHNETIUM TC 99M TETROFOSMIN IV KIT
10.9000 | PACK | Freq: Once | INTRAVENOUS | Status: AC | PRN
  Administered 2019-01-12: 10.9 via INTRAVENOUS
  Filled 2019-01-12: qty 11

## 2019-01-12 MED ORDER — TECHNETIUM TC 99M TETROFOSMIN IV KIT
32.0000 | PACK | Freq: Once | INTRAVENOUS | Status: AC | PRN
  Administered 2019-01-12: 32 via INTRAVENOUS
  Filled 2019-01-12: qty 32

## 2019-03-17 ENCOUNTER — Telehealth: Payer: Self-pay

## 2019-03-17 NOTE — Telephone Encounter (Addendum)
stress test results and Dr. Allyson Sabal recommendation to f/u in 6 mos sent in message via MyChart

## 2019-07-18 ENCOUNTER — Telehealth: Payer: Self-pay | Admitting: Cardiology

## 2019-07-18 NOTE — Telephone Encounter (Signed)
New Message   1. What dental office are you calling from? [facility]  2. What is your office phone number? [phone]   3. What is your fax number? [phone]  4. What type of procedure is the patient having performed? Tooth Extraction  5. What date is procedure scheduled or is the patient there now? 07/20/19 (if the patient is at the dentist's office question goes to their cardiologist if he/she is in the office.  If not, question should go to the DOD).   6. What is your question (ex. Antibiotics prior to procedure, holding medication-we need to know how long dentist wants pt to hold med)? Holding blood thinner medication.

## 2019-07-18 NOTE — Telephone Encounter (Signed)
   Primary Cardiologist: Quay Burow, MD  Chart reviewed as part of pre-operative protocol coverage. Patient was contacted 07/18/2019 in reference to pre-operative risk assessment for pending surgery as outlined below.  [name] [name] was last seen on 12/2018 by [name], PA.  Since that day, [name] [name] has done well with his CAD and neg stress test in Jan.  No chest pain.  Pt may hold plavix if he needs to for tooth extraction for 5 days and resume afterwards.    Therefore, based on ACC/AHA guidelines, the patient would be at acceptable risk for the planned procedure without further cardiovascular testing.   I will route this recommendation to the requesting party via Epic fax function and remove from pre-op pool.  Please call with questions.  Cecilie Kicks, NP 07/18/2019, 9:21 AM

## 2019-10-20 ENCOUNTER — Encounter: Payer: Self-pay | Admitting: Cardiovascular Disease

## 2019-10-20 ENCOUNTER — Other Ambulatory Visit: Payer: Self-pay

## 2019-10-20 ENCOUNTER — Ambulatory Visit (INDEPENDENT_AMBULATORY_CARE_PROVIDER_SITE_OTHER): Payer: PRIVATE HEALTH INSURANCE | Admitting: Cardiovascular Disease

## 2019-10-20 VITALS — BP 118/74 | HR 70 | Temp 96.8°F | Ht 72.0 in | Wt 271.0 lb

## 2019-10-20 DIAGNOSIS — R002 Palpitations: Secondary | ICD-10-CM

## 2019-10-20 DIAGNOSIS — I10 Essential (primary) hypertension: Secondary | ICD-10-CM

## 2019-10-20 DIAGNOSIS — I252 Old myocardial infarction: Secondary | ICD-10-CM | POA: Diagnosis not present

## 2019-10-20 DIAGNOSIS — E785 Hyperlipidemia, unspecified: Secondary | ICD-10-CM | POA: Diagnosis not present

## 2019-10-20 MED ORDER — METOPROLOL TARTRATE 25 MG PO TABS: 25.0000 mg | ORAL_TABLET | Freq: Two times a day (BID) | ORAL | 3 refills | Status: DC

## 2019-10-20 NOTE — Assessment & Plan Note (Signed)
History of essential hypertension blood pressure measured today 118 over 4.  He is on metoprolol 25 mg p.o. twice daily.

## 2019-10-20 NOTE — Assessment & Plan Note (Signed)
[name] [name] is noticed palpitations on occasion.  Does drink 2 cups of coffee in the morning.  I told him to limit his caffeine intake.  He will see [name], PA-C in the office back in 3 months.  If he still having palpitations we can get a 2-week Zio patch and consider increasing his beta-blocker.

## 2019-10-20 NOTE — Assessment & Plan Note (Signed)
History of hyperlipidemia on statin therapy with lipid profile performed 10/13/2018 revealing total cholesterol 100, LDL of 53 and HDL 38.

## 2019-10-20 NOTE — Progress Notes (Signed)
10/20/2019 [name] [name]   Jun 24, 1961  [id number]  Primary Physician Joycelyn Rua, MD Primary Cardiologist: Runell Gess MD [name], [place]  HPI:  [name] [name] is a 58 y.o.  moderately overweight. Caucasian male father of one son went to [employer] and who currently is a Sport and exercise psychologist in [place].  Mr. Hutmacher works in [employer]. His primary care physician is Dr. Silvestre Moment [facility]. I last saw him in the office  11/01/2018.Marland KitchenHe has a history of hypertension, diabetes, hyperlipidemia. He also has a strong family history of heart disease with father that had an MI at age 2 and a brother who died at age 52 with a history of coronary artery disease. He's had chest pain over the last several weeks and was seen by his primary care physician on 11/14/14 with PPIs. At that time his EKG showed no acute changes. He had fairly constant chest pain most the night last night and came to the emergency room today. His troponin was greater than 6 and his EKG shows inferolateral T-wave inversion. He is being admitted for a non-STEMI 11/23/14. On 11/26/14 he underwent cardiac catheterization performed by myself radially revealing a 90% mid LAD lesion which I stented using a drug-eluting stent. He had moderate ramus branch disease and normal LV function.He was on dual antiplatelet therapy with aspirin and Brilenta which has been transition to Plavix. He's gained approximately 15 pounds since I saw him last probably as a result of diet and lack of exercise.  Since I saw him almost a year ago he has noticed occasional palpitations associated with difficulty breathing during these.  They sound like PVCs.  He does drink 2 cups of coffee in the morning.  He otherwise denies chest pain or shortness of breath.    Current Meds  Medication Sig  . aspirin 81 MG chewable tablet Chew 1 tablet (81 mg total) by mouth daily.  [name] atorvastatin (LIPITOR) 80 MG tablet  TAKE 1 TABLET BY MOUTH  DAILY AT 6PM  . clopidogrel (PLAVIX) 75 MG tablet TAKE 1 TABLET BY MOUTH EVERY DAY  . Insulin Glargine (BASAGLAR KWIKPEN) 100 UNIT/ML SOPN Inject 45 Units at bedtime into the skin.  [name] LOSARTAN POTASSIUM PO Take 1 tablet daily by mouth.  . metFORMIN (GLUCOPHAGE) 1000 MG tablet Take 1 tablet (1,000 mg total) by mouth 2 (two) times daily with a meal.  . metoprolol tartrate (LOPRESSOR) 25 MG tablet Take 1 tablet (25 mg total) by mouth 2 (two) times daily.  . pioglitazone (ACTOS) 45 MG tablet Take 45 mg by mouth daily.  . [DISCONTINUED] metoprolol tartrate (LOPRESSOR) 25 MG tablet Take 1.5 tablets (37.5 mg total) by mouth 2 (two) times daily.     No Known Allergies  Social History   Socioeconomic History  . Marital status: Married    Spouse name: Not on file  . Number of children: Not on file  . Years of education: Not on file  . Highest education level: Not on file  Occupational History  . Not on file  Social Needs  . Financial resource strain: Not on file  . Food insecurity    Worry: Not on file    Inability: Not on file  . Transportation needs    Medical: Not on file    Non-medical: Not on file  Tobacco Use  . Smoking status: Former Games developer  . Smokeless tobacco: Never Used  Substance and Sexual Activity  . Alcohol use: Yes  Comment: occasional  . Drug use: No  . Sexual activity: Not on file  Lifestyle  . Physical activity    Days per week: Not on file    Minutes per session: Not on file  . Stress: Not on file  Relationships  . Social Herbalist on phone: Not on file    Gets together: Not on file    Attends religious service: Not on file    Active member of club or organization: Not on file    Attends meetings of clubs or organizations: Not on file    Relationship status: Not on file  . Intimate partner violence    Fear of current or ex partner: Not on file    Emotionally abused: Not on file    Physically abused: Not on file     Forced sexual activity: Not on file  Other Topics Concern  . Not on file  Social History Narrative  . Not on file     Review of Systems: General: negative for chills, fever, night sweats or weight changes.  Cardiovascular: negative for chest pain, dyspnea on exertion, edema, orthopnea, palpitations, paroxysmal nocturnal dyspnea or shortness of breath Dermatological: negative for rash Respiratory: negative for cough or wheezing Urologic: negative for hematuria Abdominal: negative for nausea, vomiting, diarrhea, bright red blood per rectum, melena, or hematemesis Neurologic: negative for visual changes, syncope, or dizziness All other systems reviewed and are otherwise negative except as noted above.    Blood pressure 118/74, pulse 70, temperature (!) 96.8 F (36 C), height 6' (1.829 m), weight 271 lb (122.9 kg).  General appearance: alert and no distress Neck: no adenopathy, no carotid bruit, no JVD, supple, symmetrical, trachea midline and thyroid not enlarged, symmetric, no tenderness/mass/nodules Lungs: clear to auscultation bilaterally Heart: regular rate and rhythm, S1, S2 normal, no murmur, click, rub or gallop Extremities: extremities normal, atraumatic, no cyanosis or edema Pulses: 2+ and symmetric Skin: Skin color, texture, turgor normal. No rashes or lesions Neurologic: Alert and oriented X 3, normal strength and tone. Normal symmetric reflexes. Normal coordination and gait  EKG sinus rhythm at 70 without ST or T wave changes.  I personally reviewed this EKG.  ASSESSMENT AND PLAN:   History of NSTEMI- History of CAD status post non-STEMI 11/23/2014 with cardiac cath performed 11/26/2014 by myself revealing a 90% mid LAD lesion which I stented using a drug-eluting stent.  He did have moderate ramus branch disease as well with normal LV function.  He remains on dual antiplatelet therapy.  He did have a Myoview stress test performed 01/12/2019 that showed scar without  ischemia.  He denies chest pain or shortness of breath.  Essential hypertension History of essential hypertension blood pressure measured today 118 over 4.  He is on metoprolol 25 mg p.o. twice daily.  Hyperlipidemia with target LDL less than 70 History of hyperlipidemia on statin therapy with lipid profile performed 10/13/2018 revealing total cholesterol 100, LDL of 53 and HDL 38.  Palpitations Mr. Batten is noticed palpitations on occasion.  Does drink 2 cups of coffee in the morning.  I told him to limit his caffeine intake.  He will see [name], PA-C in the office back in 3 months.  If he still having palpitations we can get a 2-week Zio patch and consider increasing his beta-blocker.      Lorretta Harp MD FACP,FACC,FAHA, [facility] 10/20/2019 11:03 AM

## 2019-10-20 NOTE — Patient Instructions (Signed)
Medication Instructions:  No changes *If you need a refill on your cardiac medications before your next appointment, please call your pharmacy*  Lab Work: None ordered If you have labs (blood work) drawn today and your tests are completely normal, you will receive your results only by: [name] MyChart Message (if you have MyChart) OR . A paper copy in the mail If you have any lab test that is abnormal or we need to change your treatment, we will call you to review the results.  Testing/Procedures: None ordered  Follow-Up: At [facility], you and your health needs are our priority.  As part of our continuing mission to provide you with exceptional heart care, we have created designated Provider Care Teams.  These Care Teams include your primary Cardiologist (physician) and Advanced Practice Providers (APPs -  Physician Assistants and Nurse Practitioners) who all work together to provide you with the care you need, when you need it.  Your next appointment:   Follow up in 3 months with an APP Follow up in 12 months with Dr. Gwenlyn Found  Other Instructions Please decrease the amount of caffeine that you drink.

## 2019-10-20 NOTE — Assessment & Plan Note (Signed)
History of CAD status post non-STEMI 11/23/2014 with cardiac cath performed 11/26/2014 by myself revealing a 90% mid LAD lesion which I stented using a drug-eluting stent.  He did have moderate ramus branch disease as well with normal LV function.  He remains on dual antiplatelet therapy.  He did have a Myoview stress test performed 01/12/2019 that showed scar without ischemia.  He denies chest pain or shortness of breath.

## 2019-12-03 ENCOUNTER — Other Ambulatory Visit: Payer: Self-pay | Admitting: Cardiovascular Disease

## 2019-12-28 ENCOUNTER — Ambulatory Visit (INDEPENDENT_AMBULATORY_CARE_PROVIDER_SITE_OTHER): Payer: PRIVATE HEALTH INSURANCE | Admitting: Cardiology

## 2019-12-28 ENCOUNTER — Telehealth: Payer: Self-pay | Admitting: Radiology

## 2019-12-28 ENCOUNTER — Other Ambulatory Visit: Payer: Self-pay

## 2019-12-28 ENCOUNTER — Encounter: Payer: Self-pay | Admitting: Cardiology

## 2019-12-28 VITALS — BP 134/84 | HR 76 | Temp 97.1°F | Ht 72.0 in | Wt 275.6 lb

## 2019-12-28 DIAGNOSIS — I2510 Atherosclerotic heart disease of native coronary artery without angina pectoris: Secondary | ICD-10-CM

## 2019-12-28 DIAGNOSIS — E785 Hyperlipidemia, unspecified: Secondary | ICD-10-CM | POA: Diagnosis not present

## 2019-12-28 DIAGNOSIS — I10 Essential (primary) hypertension: Secondary | ICD-10-CM

## 2019-12-28 DIAGNOSIS — R002 Palpitations: Secondary | ICD-10-CM | POA: Diagnosis not present

## 2019-12-28 DIAGNOSIS — Z9861 Coronary angioplasty status: Secondary | ICD-10-CM

## 2019-12-28 DIAGNOSIS — Z794 Long term (current) use of insulin: Secondary | ICD-10-CM

## 2019-12-28 DIAGNOSIS — E119 Type 2 diabetes mellitus without complications: Secondary | ICD-10-CM

## 2019-12-28 NOTE — Assessment & Plan Note (Signed)
11/23/14 NSTEMI  S/P mLAD DES 11/26/14. RI with 60% stenosis, treat medically, EF 60%

## 2019-12-28 NOTE — Assessment & Plan Note (Signed)
On statin Rx- followed by PCP 

## 2019-12-28 NOTE — Assessment & Plan Note (Signed)
No nephropathy, retinopathy, or neuropathy 

## 2019-12-28 NOTE — Assessment & Plan Note (Signed)
Controlled-Mild LVH on echo

## 2019-12-28 NOTE — Patient Instructions (Signed)
Medication Instructions:  Your physician recommends that you continue on your current medications as directed. Please refer to the Current Medication list given to you today. *If you need a refill on your cardiac medications before your next appointment, please call your pharmacy*  Lab Work: None  If you have labs (blood work) drawn today and your tests are completely normal, you will receive your results only by: [name] MyChart Message (if you have MyChart) OR . A paper copy in the mail If you have any lab test that is abnormal or we need to change your treatment, we will call you to review the results.  Testing/Procedures: Christena Deem- Long Term Monitor Instructions   Your physician has requested you wear your ZIO patch monitor 7 days.   This is a single patch monitor.  [employer] supplies one patch monitor per enrollment.  Additional stickers are not available.   Please do not apply patch if you will be having a Nuclear Stress Test, Echocardiogram, Cardiac CT, MRI, or Chest Xray during the time frame you would be wearing the monitor. The patch cannot be worn during these tests.  You cannot remove and re-apply the ZIO XT patch monitor.   Your ZIO patch monitor will be sent USPS Priority mail from [facility] directly to your home address. The monitor may also be mailed to a PO BOX if home delivery is not available.   It may take 3-5 days to receive your monitor after you have been enrolled.   Once you have received you monitor, please review enclosed instructions.  Your monitor has already been registered assigning a specific monitor serial # to you.   Applying the monitor   Shave hair from upper left chest.   Hold abrader disc by orange tab.  Rub abrader in 40 strokes over left upper chest as indicated in your monitor instructions.   Clean area with 4 enclosed alcohol pads .  Use all pads to assure are is cleaned thoroughly.  Let dry.   Apply patch as indicated in monitor instructions.   Patch will be place under collarbone on left side of chest with arrow pointing upward.   Rub patch adhesive wings for 2 minutes.Remove white label marked "1".  Remove white label marked "2".  Rub patch adhesive wings for 2 additional minutes.   While looking in a mirror, press and release button in center of patch.  A small green light will flash 3-4 times .  This will be your only indicator the monitor has been turned on.     Do not shower for the first 24 hours.  You may shower after the first 24 hours.   Press button if you feel a symptom. You will hear a small click.  Record Date, Time and Symptom in the Patient Log Book.   When you are ready to remove patch, follow instructions on last 2 pages of Patient Log Book.  Stick patch monitor onto last page of Patient Log Book.   Place Patient Log Book in [place] box.  Use locking tab on box and tape box closed securely.  The [employer] and [employer] has [employer] on it.  Please place in mailbox as soon as possible.  Your physician should have your test results approximately 7 days after the monitor has been mailed back to [facility].   Call [facility] Customer Care at [phone] if you have questions regarding your ZIO XT patch monitor.  Call them immediately if you see an orange  light blinking on your monitor.   If your monitor falls off in less than 4 days contact our Monitor department at [phone].  If your monitor becomes loose or falls off after 4 days call [employer] at [phone] for suggestions on securing your monitor.   This will be mailed to you, please expect 7-10 days to receive.        Follow-Up: At [facility], you and your health needs are our priority.  As part of our continuing mission to provide you with exceptional heart care, we have created designated Provider Care Teams.  These Care Teams include your primary Cardiologist (physician) and Advanced Practice Providers (APPs -  Physician Assistants and  Nurse Practitioners) who all work together to provide you with the care you need, when you need it.  Your next appointment:   4 week(s)  The format for your next appointment:   Virtual Visit   Provider:   [name], PA-C   Other Instructions

## 2019-12-28 NOTE — Assessment & Plan Note (Signed)
PVCs- trigeminy noted today

## 2019-12-28 NOTE — Telephone Encounter (Signed)
Enrolled patient for a 7 day Zio monitor to be mailed to patients home.  

## 2019-12-28 NOTE — Progress Notes (Signed)
Cardiology Office Note:    Date:  12/28/2019   ID:  [name] [name], DOB [birth year], MRN [id number]  PCP:  [name] Rua, MD  Cardiologist:  Nanetta Batty, MD  Electrophysiologist:  None   Referring MD: [name] Rua, MD   CC: "hard heart beats"  History of Present Illness:    [name] [name]") is a 59 y.o. male with a hx of a NSTEMI in December 2015.  At that time he underwent mid LAD intervention with a DES.  He had some moderate residual CAD in the ramus intermedius.  His LV function was normal.  Other medical problems include insulin-dependent diabetes, treated hypertension, and treated dyslipidemia.  He is done quite well since his NSTEMI. In Jan 2020. In Jan 2020 he did have some chest pain, a Myoview done then was low risk.     He seen in the office today with complaints of "hard heart beats".  He denies any sustained tachycardia.  This has been going on for the past few months.  He has already stopped caffeine use. He denies chest pain or unusual dyspnea.   Past Medical History:  Diagnosis Date  . CAD (coronary artery disease), native coronary artery, residual RCA disease  11/27/2014  . Diabetes mellitus without complication (HCC)    type 2  . Family history of heart disease   . Hyperlipidemia   . Hypertension   . S/P angioplasty with stent mLAD 11/26/14 11/27/2014    Past Surgical History:  Procedure Laterality Date  . CERVICAL FUSION    . LEFT HEART CATHETERIZATION WITH CORONARY ANGIOGRAM N/A 11/26/2014   Procedure: LEFT HEART CATHETERIZATION WITH CORONARY ANGIOGRAM;  Surgeon: Runell Gess, MD;  Location[facility] CATH LAB;  Service: Cardiovascular;  Laterality: N/A;    Current Medications: Current Meds  Medication Sig  . aspirin 81 MG chewable tablet Chew 1 tablet (81 mg total) by mouth daily.  [name] atorvastatin (LIPITOR) 80 MG tablet TAKE 1 TABLET BY MOUTH  DAILY AT 6PM  . clopidogrel (PLAVIX) 75 MG tablet TAKE 1 TABLET BY MOUTH EVERY DAY  .  Insulin Glargine (LANTUS SOLOSTAR) 100 UNIT/ML Solostar Pen Inject 45 Units into the skin daily.  [name] losartan (COZAAR) 100 MG tablet Take 100 mg by mouth daily.  . metFORMIN (GLUCOPHAGE) 1000 MG tablet Take 1 tablet (1,000 mg total) by mouth 2 (two) times daily with a meal.  . metoprolol tartrate (LOPRESSOR) 25 MG tablet Take 1 tablet (25 mg total) by mouth 2 (two) times daily.  . pioglitazone (ACTOS) 45 MG tablet Take 45 mg by mouth daily.     Allergies:   Patient has no known allergies.   Social History   Socioeconomic History  . Marital status: Married    Spouse name: Not on file  . Number of children: Not on file  . Years of education: Not on file  . Highest education level: Not on file  Occupational History  . Not on file  Tobacco Use  . Smoking status: Former Games developer  . Smokeless tobacco: Never Used  Substance and Sexual Activity  . Alcohol use: Yes    Comment: occasional  . Drug use: No  . Sexual activity: Not on file  Other Topics Concern  . Not on file  Social History Narrative  . Not on file   Social Determinants of Health   Financial Resource Strain:   . Difficulty of Paying Living Expenses: Not on file  Food Insecurity:   . Worried About [employer] of  Food in the Last Year: Not on file  . Ran Out of Food in the Last Year: Not on file  Transportation Needs:   . Lack of Transportation (Medical): Not on file  . Lack of Transportation (Non-Medical): Not on file  Physical Activity:   . Days of Exercise per Week: Not on file  . Minutes of Exercise per Session: Not on file  Stress:   . Feeling of Stress : Not on file  Social Connections:   . Frequency of Communication with Friends and Family: Not on file  . Frequency of Social Gatherings with Friends and Family: Not on file  . Attends Religious Services: Not on file  . Active Member of Clubs or Organizations: Not on file  . Attends Archivist Meetings: Not on file  . Marital Status: Not on file      Family History: The patient's family history includes Heart attack in his brother and father.  ROS:   Please see the history of present illness.     All other systems reviewed and are negative.  EKGs/Labs/Other Studies Reviewed:    The following studies were reviewed today: Myoview 01/12/2019  EKG:  EKG is ordered today.  The ekg ordered today demonstrates NSR- trigeminy. HR 100  Recent Labs: No results found for requested labs within last 8760 hours.  Recent Lipid Panel    Component Value Date/Time   CHOL 90 01/24/2015 0827   TRIG 47 01/24/2015 0827   HDL 34 (L) 01/24/2015 0827   CHOLHDL 2.6 01/24/2015 0827   VLDL 9 01/24/2015 0827   LDLCALC 47 01/24/2015 0827    Physical Exam:    VS:  BP 134/84   Pulse 76   Temp (!) 97.1 F (36.2 C)   Ht 6' (1.829 m)   Wt 275 lb 9.6 oz (125 kg)   SpO2 97%   BMI 37.38 kg/m     Wt Readings from Last 3 Encounters:  12/28/19 275 lb 9.6 oz (125 kg)  10/20/19 271 lb (122.9 kg)  01/12/19 276 lb (125.2 kg)     GEN: Overweight caucasian male, well developed in no acute distress HEENT: Normal NECK: No JVD; No carotid bruits CARDIAC: RRR, no murmurs, rubs, gallops, extra systole noted RESPIRATORY:  Clear to auscultation without rales, wheezing or rhonchi  ABDOMEN: Soft, non-tender, non-distended MUSCULOSKELETAL:  No edema; No deformity  SKIN: Warm and dry NEUROLOGIC:  Alert and oriented x 3 PSYCHIATRIC:  Normal affect   ASSESSMENT:    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 RI with 60% stenosis, treat medically, EF 60%  Essential hypertension Controlled-Mild LVH on echo  Hyperlipidemia with target LDL less than 70 On statin Rx-followed by PCP  Insulin dependent type 2 diabetes mellitus (HCC) No nephropathy, retinopathy, or neuropathy  PLAN:    Check 7 day ZIO to further evaluate frequency of PVCs.  Consider echo for LVF if PVCs are frequent. Virtual f/u in 3-4  weeks.    Medication Adjustments/Labs and Tests Ordered: Current medicines are reviewed at length with the patient today.  Concerns regarding medicines are outlined above.  Orders Placed This Encounter  Procedures  . LONG TERM MONITOR (3-14 DAYS)  . EKG 12-Lead   No orders of the defined types were placed in this encounter.   Patient Instructions  Medication Instructions:  Your physician recommends that you continue on your current medications as directed. Please refer to the Current Medication list given  to you today. *If you need a refill on your cardiac medications before your next appointment, please call your pharmacy*  Lab Work: None  If you have labs (blood work) drawn today and your tests are completely normal, you will receive your results only by: [name] MyChart Message (if you have MyChart) OR . A paper copy in the mail If you have any lab test that is abnormal or we need to change your treatment, we will call you to review the results.  Testing/Procedures: Christena Deem- Long Term Monitor Instructions   Your physician has requested you wear your ZIO patch monitor 7 days.   This is a single patch monitor.  [employer] supplies one patch monitor per enrollment.  Additional stickers are not available.   Please do not apply patch if you will be having a Nuclear Stress Test, Echocardiogram, Cardiac CT, MRI, or Chest Xray during the time frame you would be wearing the monitor. The patch cannot be worn during these tests.  You cannot remove and re-apply the ZIO XT patch monitor.   Your ZIO patch monitor will be sent USPS Priority mail from [facility] directly to your home address. The monitor may also be mailed to a PO BOX if home delivery is not available.   It may take 3-5 days to receive your monitor after you have been enrolled.   Once you have received you monitor, please review enclosed instructions.  Your monitor has already been registered assigning a specific monitor  serial # to you.   Applying the monitor   Shave hair from upper left chest.   Hold abrader disc by orange tab.  Rub abrader in 40 strokes over left upper chest as indicated in your monitor instructions.   Clean area with 4 enclosed alcohol pads .  Use all pads to assure are is cleaned thoroughly.  Let dry.   Apply patch as indicated in monitor instructions.  Patch will be place under collarbone on left side of chest with arrow pointing upward.   Rub patch adhesive wings for 2 minutes.Remove white label marked "1".  Remove white label marked "2".  Rub patch adhesive wings for 2 additional minutes.   While looking in a mirror, press and release button in center of patch.  A small green light will flash 3-4 times .  This will be your only indicator the monitor has been turned on.     Do not shower for the first 24 hours.  You may shower after the first 24 hours.   Press button if you feel a symptom. You will hear a small click.  Record Date, Time and Symptom in the Patient Log Book.   When you are ready to remove patch, follow instructions on last 2 pages of Patient Log Book.  Stick patch monitor onto last page of Patient Log Book.   Place Patient Log Book in [place] box.  Use locking tab on box and tape box closed securely.  The [employer] and [employer] has [employer] on it.  Please place in mailbox as soon as possible.  Your physician should have your test results approximately 7 days after the monitor has been mailed back to [facility].   Call [facility] Customer Care at [phone] if you have questions regarding your ZIO XT patch monitor.  Call them immediately if you see an orange light blinking on your monitor.   If your monitor falls off in less than 4 days contact our Monitor department at [phone].  If your monitor becomes loose or falls off after 4 days call [employer] at [phone] for suggestions on securing your monitor.   This will be mailed to you, please expect  7-10 days to receive.        Follow-Up: At [facility], you and your health needs are our priority.  As part of our continuing mission to provide you with exceptional heart care, we have created designated Provider Care Teams.  These Care Teams include your primary Cardiologist (physician) and Advanced Practice Providers (APPs -  Physician Assistants and Nurse Practitioners) who all work together to provide you with the care you need, when you need it.  Your next appointment:   4 week(s)  The format for your next appointment:   Virtual Visit   Provider:   [name], PA-C   Other Instructions     Signed, [name], PA-C  12/28/2019 10:33 AM    [facility]

## 2020-01-02 ENCOUNTER — Other Ambulatory Visit (INDEPENDENT_AMBULATORY_CARE_PROVIDER_SITE_OTHER): Payer: PRIVATE HEALTH INSURANCE

## 2020-01-02 DIAGNOSIS — R002 Palpitations: Secondary | ICD-10-CM

## 2020-01-25 ENCOUNTER — Encounter: Payer: Self-pay | Admitting: *Deleted

## 2020-01-25 ENCOUNTER — Encounter: Payer: Self-pay | Admitting: Cardiology

## 2020-01-25 ENCOUNTER — Telehealth (INDEPENDENT_AMBULATORY_CARE_PROVIDER_SITE_OTHER): Payer: PRIVATE HEALTH INSURANCE | Admitting: Cardiology

## 2020-01-25 ENCOUNTER — Ambulatory Visit: Payer: Self-pay | Admitting: Cardiology

## 2020-01-25 ENCOUNTER — Telehealth: Payer: Self-pay

## 2020-01-25 VITALS — BP 130/66 | HR 75 | Ht 72.0 in | Wt 267.0 lb

## 2020-01-25 DIAGNOSIS — Z794 Long term (current) use of insulin: Secondary | ICD-10-CM

## 2020-01-25 DIAGNOSIS — Z955 Presence of coronary angioplasty implant and graft: Secondary | ICD-10-CM

## 2020-01-25 DIAGNOSIS — I10 Essential (primary) hypertension: Secondary | ICD-10-CM

## 2020-01-25 DIAGNOSIS — I2510 Atherosclerotic heart disease of native coronary artery without angina pectoris: Secondary | ICD-10-CM

## 2020-01-25 DIAGNOSIS — I25119 Atherosclerotic heart disease of native coronary artery with unspecified angina pectoris: Secondary | ICD-10-CM

## 2020-01-25 DIAGNOSIS — Z9861 Coronary angioplasty status: Secondary | ICD-10-CM

## 2020-01-25 DIAGNOSIS — I493 Ventricular premature depolarization: Secondary | ICD-10-CM | POA: Insufficient documentation

## 2020-01-25 DIAGNOSIS — E785 Hyperlipidemia, unspecified: Secondary | ICD-10-CM

## 2020-01-25 DIAGNOSIS — E119 Type 2 diabetes mellitus without complications: Secondary | ICD-10-CM

## 2020-01-25 DIAGNOSIS — R002 Palpitations: Secondary | ICD-10-CM | POA: Diagnosis not present

## 2020-01-25 DIAGNOSIS — R0609 Other forms of dyspnea: Secondary | ICD-10-CM

## 2020-01-25 DIAGNOSIS — R0600 Dyspnea, unspecified: Secondary | ICD-10-CM

## 2020-01-25 NOTE — Telephone Encounter (Signed)
Contacted patient to discuss AVS Instructions. Gave patient Luke's recommendations from today's virtual office visit. Informed patient that someone from the scheduling dept will be in contact with them to schedule their follow up appt and testing. Patient voiced understanding; AVS printed and mailed to patient.    

## 2020-01-25 NOTE — Progress Notes (Signed)
Virtual Visit via Telephone Note   This visit type was conducted due to national recommendations for restrictions regarding the COVID-19 Pandemic (e.g. social distancing) in an effort to limit this patient's exposure and mitigate transmission in our community.  Due to his co-morbid illnesses, this patient is at least at moderate risk for complications without adequate follow up.  This format is felt to be most appropriate for this patient at this time.  The patient did not have access to video technology/had technical difficulties with video requiring transitioning to audio format only (telephone).  All issues noted in this document were discussed and addressed.  No physical exam could be performed with this format.  Please refer to the patient's chart for his  consent to telehealth for [facility].   Date:  01/25/2020   ID:  [name] [name], DOB 1961-04-05, MRN [id number]  Patient Location: Home Provider Location: Office  PCP:  Orpah Melter, MD  Cardiologist:  Quay Burow, MD  Electrophysiologist:  None   Evaluation Performed:  Follow-Up Visit  Chief Complaint:  Exertional dyspnea, palpitations  History of Present Illness:    [name] [name] is a 59 y.o. male with a hx of aNSTEMIin December 2015. At that time he underwent mid LAD intervention with a DES. He had some moderate residual CAD in the ramus intermedius. His LV function was normal. Other medical problems include insulin-dependent diabetes, treated hypertension, and treated dyslipidemia.  In Jan 2020 he did have some chest pain, a Myoview done then was low risk.   I saw him in the office 12/28/2019.  He been complaining of palpitations described as "hard heartbeats".  At that time he denied any unusual chest pain or dyspnea.  His EKG showed frequent PVCs.  He is on a beta-blocker.  He had already cut back on his caffeine use.  He said his symptoms have been going on for a month or 2.  On reviewing his  previous EKGs I did not note any significant ectopy.  He was set up for an outpatient monitor.  This revealed bigeminy brief runs of nonsustained VT and frequent PVCs.  He was contacted today for follow-up.  He tells me actually when he had the monitor on he did not feel like he was having a lot of palpitations.  When I questioned him further he admitted to some dyspnea on exertion.  He has not had chest pain.   The patient does not have symptoms concerning for COVID-19 infection (fever, chills, cough, or new shortness of breath).    Past Medical History:  Diagnosis Date  . CAD (coronary artery disease), native coronary artery, residual RCA disease  11/27/2014  . Diabetes mellitus without complication ([place])    type 2  . Family history of heart disease   . Hyperlipidemia   . Hypertension   . S/P angioplasty with stent mLAD 11/26/14 11/27/2014   Past Surgical History:  Procedure Laterality Date  . CERVICAL FUSION    . LEFT HEART CATHETERIZATION WITH CORONARY ANGIOGRAM N/A 11/26/2014   Procedure: LEFT HEART CATHETERIZATION WITH CORONARY ANGIOGRAM;  Surgeon: Lorretta Harp, MD;  Location: [facility] CATH LAB;  Service: Cardiovascular;  Laterality: N/A;     Current Meds  Medication Sig  . aspirin 81 MG chewable tablet Chew 1 tablet (81 mg total) by mouth daily.  [name] atorvastatin (LIPITOR) 80 MG tablet TAKE 1 TABLET BY MOUTH  DAILY AT 6PM  . clopidogrel (PLAVIX) 75 MG tablet TAKE 1 TABLET BY MOUTH EVERY DAY  .  Insulin Glargine (LANTUS SOLOSTAR) 100 UNIT/ML Solostar Pen Inject 45 Units into the skin daily.  [name] losartan (COZAAR) 100 MG tablet Take 100 mg by mouth daily.  . metFORMIN (GLUCOPHAGE) 1000 MG tablet Take 1 tablet (1,000 mg total) by mouth 2 (two) times daily with a meal.  . metoprolol tartrate (LOPRESSOR) 25 MG tablet Take 1 tablet (25 mg total) by mouth 2 (two) times daily.  . pioglitazone (ACTOS) 45 MG tablet Take 45 mg by mouth daily.     Allergies:   Patient has no known allergies.    Social History   Tobacco Use  . Smoking status: Former Games developer  . Smokeless tobacco: Never Used  Substance Use Topics  . Alcohol use: Yes    Comment: occasional  . Drug use: No     Family Hx: The patient's family history includes Heart attack in his brother and father.  ROS:   Please see the history of present illness.    All other systems reviewed and are negative.   Prior CV studies:   The following studies were reviewed today: Monitor 01/19/2020 Myoview Jan 2020  Labs/Other Tests and Data Reviewed:    EKG:  An ECG dated 12/28/2019 was personally reviewed today and demonstrated:  NSR- trigeminy, PACs  Recent Labs: No results found for requested labs within last 8760 hours.   Recent Lipid Panel Lab Results  Component Value Date/Time   CHOL 90 01/24/2015 08:27 AM   TRIG 47 01/24/2015 08:27 AM   HDL 34 (L) 01/24/2015 08:27 AM   CHOLHDL 2.6 01/24/2015 08:27 AM   LDLCALC 47 01/24/2015 08:27 AM    Wt Readings from Last 3 Encounters:  01/25/20 267 lb (121.1 kg)  12/28/19 275 lb 9.6 oz (125 kg)  10/20/19 271 lb (122.9 kg)     Objective:    Vital Signs:  BP 130/66   Pulse 75   Ht 6' (1.829 m)   Wt 267 lb (121.1 kg)   BMI 36.21 kg/m    VITAL SIGNS:  reviewed  ASSESSMENT & PLAN:    Palpitations PVCs-trigeminy- this appears to be a new finding.  CAD -S/P PCI 11/23/14 NSTEMI  S/P mLAD DES 11/26/14. RI with 60% stenosis, treat medically, EF 60%  Essential hypertension Controlled-Mild LVH on echo  Hyperlipidemia with target LDL less than 70 On statin Rx-followed by PCP  Insulin dependent type 2 diabetes mellitus (HCC) No nephropathy, retinopathy, or neuropathy  Plan: I am concerned about progression of CAD with the patients new V-ectopy and his admission today of DOE.  I explained to him that with IDDM he may not have classic symptoms and suggested we proceed with a Myoview and echo for further evaluation.    COVID-19 Education: The signs and  symptoms of COVID-19 were discussed with the patient and how to seek care for testing (follow up with PCP or arrange E-visit).  The importance of social distancing was discussed today.  Time:   Today, I have spent 10 minutes with the patient with telehealth technology discussing the above problems.     Medication Adjustments/Labs and Tests Ordered: Current medicines are reviewed at length with the patient today.  Concerns regarding medicines are outlined above.   Tests Ordered: No orders of the defined types were placed in this encounter.   Medication Changes: No orders of the defined types were placed in this encounter.   Follow Up:  In Person Dr Allyson Sabal 3-4 weeks  Signed, [name], PA-C  01/25/2020 10:14 AM  [facility]

## 2020-01-25 NOTE — Patient Instructions (Signed)
Medication Instructions:  Your physician recommends that you continue on your current medications as directed. Please refer to the Current Medication list given to you today. *If you need a refill on your cardiac medications before your next appointment, please call your pharmacy*  Lab Work: None  If you have labs (blood work) drawn today and your tests are completely normal, you will receive your results only by: [name] MyChart Message (if you have MyChart) OR . A paper copy in the mail If you have any lab test that is abnormal or we need to change your treatment, we will call you to review the results.  Testing/Procedures: Your physician has requested that you have a lexiscan myoview. For further information please visit [email or web address] Please follow instruction sheet, as given.  Your physician has requested that you have an echocardiogram. Echocardiography is a painless test that uses sound waves to create images of your heart. It provides your doctor with information about the size and shape of your heart and how well your heart's chambers and valves are working. This procedure takes approximately one hour. There are no restrictions for this procedure. TEST WILL BE COMPLETED AT [address]  Follow-Up: At [facility], you and your health needs are our priority.  As part of our continuing mission to provide you with exceptional heart care, we have created designated Provider Care Teams.  These Care Teams include your primary Cardiologist (physician) and Advanced Practice Providers (APPs -  Physician Assistants and Nurse Practitioners) who all work together to provide you with the care you need, when you need it.  Your next appointment:   4 week(s)  The format for your next appointment:   In Person  Provider:   Nanetta Batty, MD  Other Instructions

## 2020-01-31 ENCOUNTER — Telehealth (HOSPITAL_COMMUNITY): Payer: Self-pay

## 2020-01-31 NOTE — Telephone Encounter (Signed)
Encounter complete. 

## 2020-02-01 ENCOUNTER — Telehealth (HOSPITAL_COMMUNITY): Payer: Self-pay | Admitting: *Deleted

## 2020-02-01 NOTE — Telephone Encounter (Signed)
Left message

## 2020-02-02 ENCOUNTER — Ambulatory Visit (HOSPITAL_COMMUNITY)
Admission: RE | Admit: 2020-02-02 | Payer: PRIVATE HEALTH INSURANCE | Source: Ambulatory Visit | Attending: Cardiology | Admitting: Cardiology

## 2020-02-02 ENCOUNTER — Encounter (HOSPITAL_COMMUNITY): Payer: Self-pay

## 2020-02-08 ENCOUNTER — Ambulatory Visit (HOSPITAL_COMMUNITY): Payer: PRIVATE HEALTH INSURANCE | Attending: Cardiovascular Disease

## 2020-02-08 ENCOUNTER — Other Ambulatory Visit: Payer: Self-pay

## 2020-02-08 DIAGNOSIS — Z9861 Coronary angioplasty status: Secondary | ICD-10-CM | POA: Diagnosis present

## 2020-02-08 DIAGNOSIS — E785 Hyperlipidemia, unspecified: Secondary | ICD-10-CM | POA: Insufficient documentation

## 2020-02-08 DIAGNOSIS — I10 Essential (primary) hypertension: Secondary | ICD-10-CM

## 2020-02-08 DIAGNOSIS — I493 Ventricular premature depolarization: Secondary | ICD-10-CM | POA: Diagnosis present

## 2020-02-08 DIAGNOSIS — I2510 Atherosclerotic heart disease of native coronary artery without angina pectoris: Secondary | ICD-10-CM

## 2020-02-08 DIAGNOSIS — E119 Type 2 diabetes mellitus without complications: Secondary | ICD-10-CM | POA: Insufficient documentation

## 2020-02-08 DIAGNOSIS — Z794 Long term (current) use of insulin: Secondary | ICD-10-CM | POA: Diagnosis present

## 2020-02-08 DIAGNOSIS — R0600 Dyspnea, unspecified: Secondary | ICD-10-CM | POA: Diagnosis present

## 2020-02-09 ENCOUNTER — Telehealth (HOSPITAL_COMMUNITY): Payer: Self-pay

## 2020-02-09 NOTE — Telephone Encounter (Signed)
Encounter complete. 

## 2020-02-12 ENCOUNTER — Other Ambulatory Visit: Payer: Self-pay | Admitting: *Deleted

## 2020-02-12 MED ORDER — METOPROLOL TARTRATE 25 MG PO TABS: 37.5000 mg | ORAL_TABLET | Freq: Two times a day (BID) | ORAL | 3 refills | Status: DC

## 2020-02-14 ENCOUNTER — Other Ambulatory Visit: Payer: Self-pay

## 2020-02-14 ENCOUNTER — Ambulatory Visit (HOSPITAL_COMMUNITY)
Admission: RE | Admit: 2020-02-14 | Discharge: 2020-02-14 | Disposition: A | Discharge status: Home / Self Care | Payer: PRIVATE HEALTH INSURANCE | Source: Ambulatory Visit | Attending: Internal Medicine | Admitting: Internal Medicine

## 2020-02-14 DIAGNOSIS — R0600 Dyspnea, unspecified: Secondary | ICD-10-CM | POA: Insufficient documentation

## 2020-02-14 DIAGNOSIS — E119 Type 2 diabetes mellitus without complications: Secondary | ICD-10-CM | POA: Diagnosis present

## 2020-02-14 DIAGNOSIS — I493 Ventricular premature depolarization: Secondary | ICD-10-CM | POA: Diagnosis not present

## 2020-02-14 DIAGNOSIS — E785 Hyperlipidemia, unspecified: Secondary | ICD-10-CM | POA: Diagnosis present

## 2020-02-14 DIAGNOSIS — Z9861 Coronary angioplasty status: Secondary | ICD-10-CM | POA: Insufficient documentation

## 2020-02-14 DIAGNOSIS — I2510 Atherosclerotic heart disease of native coronary artery without angina pectoris: Secondary | ICD-10-CM | POA: Diagnosis present

## 2020-02-14 DIAGNOSIS — Z794 Long term (current) use of insulin: Secondary | ICD-10-CM | POA: Insufficient documentation

## 2020-02-14 DIAGNOSIS — I10 Essential (primary) hypertension: Secondary | ICD-10-CM | POA: Diagnosis not present

## 2020-02-14 LAB — MYOCARDIAL PERFUSION IMAGING
Peak HR: 100 {beats}/min
Rest HR: 75 {beats}/min
SDS: 2
SRS: 4
SSS: 6
TID: 1.12

## 2020-02-14 MED ORDER — REGADENOSON 0.4 MG/5ML IV SOLN
0.4000 mg | Freq: Once | INTRAVENOUS | Status: AC
  Administered 2020-02-14: 0.4 mg via INTRAVENOUS

## 2020-02-14 MED ORDER — TECHNETIUM TC 99M TETROFOSMIN IV KIT
32.2000 | PACK | Freq: Once | INTRAVENOUS | Status: AC | PRN
  Administered 2020-02-14: 32.2 via INTRAVENOUS
  Filled 2020-02-14: qty 33

## 2020-02-14 MED ORDER — TECHNETIUM TC 99M TETROFOSMIN IV KIT
10.2000 | PACK | Freq: Once | INTRAVENOUS | Status: AC | PRN
  Administered 2020-02-14: 10.2 via INTRAVENOUS
  Filled 2020-02-14: qty 11

## 2020-02-21 ENCOUNTER — Other Ambulatory Visit: Payer: Self-pay

## 2020-02-21 ENCOUNTER — Encounter: Payer: Self-pay | Admitting: Cardiovascular Disease

## 2020-02-21 ENCOUNTER — Ambulatory Visit (INDEPENDENT_AMBULATORY_CARE_PROVIDER_SITE_OTHER): Payer: PRIVATE HEALTH INSURANCE | Admitting: Cardiovascular Disease

## 2020-02-21 VITALS — BP 126/62 | HR 56 | Temp 93.9°F | Ht 72.0 in | Wt 270.0 lb

## 2020-02-21 DIAGNOSIS — E785 Hyperlipidemia, unspecified: Secondary | ICD-10-CM | POA: Diagnosis not present

## 2020-02-21 DIAGNOSIS — I252 Old myocardial infarction: Secondary | ICD-10-CM | POA: Diagnosis not present

## 2020-02-21 DIAGNOSIS — I10 Essential (primary) hypertension: Secondary | ICD-10-CM | POA: Diagnosis not present

## 2020-02-21 DIAGNOSIS — I493 Ventricular premature depolarization: Secondary | ICD-10-CM

## 2020-02-21 NOTE — Assessment & Plan Note (Signed)
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  He is on 37.5 mg grams of metoprolol twice daily with a resting heart rate of 56.  He also has an EF of 45 to 50%, somewhat lower than he has had in the past.  I am wondering whether his symptoms are all related to PVCs.  He is cut his coffee to decaffeinated.  Going to refer him to Dr. Ladona Ridgel for EP evaluation.

## 2020-02-21 NOTE — Progress Notes (Signed)
02/21/2020 [name] [name]   [birth year]  [id number]  Primary Physician [name] Rua, MD Primary Cardiologist: [name] Gess MD [name] [name], [place]  HPI:  [name] [name] is a 59 y.o.  moderately overweight. Caucasian male father of one son went to [employer] and who currently is a Sport and exercise psychologist in [place].  [name] works in [employer]. His primary care physician is Dr. Silvestre Moment [facility]. I last saw him in the office  10/20/2019.[name] KitchenHe has a history of hypertension, diabetes, hyperlipidemia. He also has a strong family history of heart disease with father that had an MI at age 75 and a brother who died at age 42 with a history of coronary artery disease. He's had chest pain over the last several weeks and was seen by his primary care physician on 11/14/14 with PPIs. At that time his EKG showed no acute changes. He had fairly constant chest pain most the night last night and came to the emergency room today. His troponin was greater than 6 and his EKG shows inferolateral T-wave inversion. He is being admitted for a non-STEMI 11/23/14. On 11/26/14 he underwent cardiac catheterization performed by myself radially revealing a 90% mid LAD lesion which I stented using a drug-eluting stent. He had moderate ramus branch disease and normal LV function.He was on dual antiplatelet therapy with aspirin and Brilenta which has been transition to Plavix. He's gained approximately 15 pounds since I saw him last probably as a result of diet and lack of exercise.  Since I saw him about 4 months ago he did see [name] in the office complaining of some shortness of breath.  Myoview stress test showed apical thinning and a 2D echo showed EF of 45 to 50%.  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  He is on a moderate dose of p.o. beta-blocker (37.5 mg p.o. twice daily of metoprolol) and his changes coffee to decaf.  He denies chest  pain but does complain of some dyspnea on exertion.   Current Meds  Medication Sig  . aspirin 81 MG chewable tablet Chew 1 tablet (81 mg total) by mouth daily.  [name] [name] atorvastatin (LIPITOR) 80 MG tablet TAKE 1 TABLET BY MOUTH  DAILY AT 6PM  . clopidogrel (PLAVIX) 75 MG tablet TAKE 1 TABLET BY MOUTH EVERY DAY  . Insulin Glargine (LANTUS SOLOSTAR) 100 UNIT/ML Solostar Pen Inject 45 Units into the skin daily.  [name] [name] losartan (COZAAR) 100 MG tablet Take 100 mg by mouth daily.  . metFORMIN (GLUCOPHAGE) 1000 MG tablet Take 1 tablet (1,000 mg total) by mouth 2 (two) times daily with a meal.  . metoprolol tartrate (LOPRESSOR) 25 MG tablet Take 1.5 tablets (37.5 mg total) by mouth 2 (two) times daily.  . pioglitazone (ACTOS) 45 MG tablet Take 45 mg by mouth daily.     No Known Allergies  Social History   Socioeconomic History  . Marital status: Married    Spouse name: Not on file  . Number of children: Not on file  . Years of education: Not on file  . Highest education level: Not on file  Occupational History  . Not on file  Tobacco Use  . Smoking status: Former Games developer  . Smokeless tobacco: Never Used  Substance and Sexual Activity  . Alcohol use: Yes    Comment: occasional  . Drug use: No  . Sexual activity: Not on file  Other Topics Concern  . Not on  file  Social History Narrative  . Not on file   Social Determinants of Health   Financial Resource Strain:   . Difficulty of Paying Living Expenses: Not on file  Food Insecurity:   . Worried About Charity fundraiser in the Last Year: Not on file  . Ran Out of Food in the Last Year: Not on file  Transportation Needs:   . Lack of Transportation (Medical): Not on file  . Lack of Transportation (Non-Medical): Not on file  Physical Activity:   . Days of Exercise per Week: Not on file  . Minutes of Exercise per Session: Not on file  Stress:   . Feeling of Stress : Not on file  Social Connections:   . Frequency of Communication with  Friends and Family: Not on file  . Frequency of Social Gatherings with Friends and Family: Not on file  . Attends Religious Services: Not on file  . Active Member of Clubs or Organizations: Not on file  . Attends Archivist Meetings: Not on file  . Marital Status: Not on file  Intimate Partner Violence:   . Fear of Current or Ex-Partner: Not on file  . Emotionally Abused: Not on file  . Physically Abused: Not on file  . Sexually Abused: Not on file     Review of Systems: General: negative for chills, fever, night sweats or weight changes.  Cardiovascular: negative for chest pain, dyspnea on exertion, edema, orthopnea, palpitations, paroxysmal nocturnal dyspnea or shortness of breath Dermatological: negative for rash Respiratory: negative for cough or wheezing Urologic: negative for hematuria Abdominal: negative for nausea, vomiting, diarrhea, bright red blood per rectum, melena, or hematemesis Neurologic: negative for visual changes, syncope, or dizziness All other systems reviewed and are otherwise negative except as noted above.    Blood pressure 126/62, pulse (!) 56, temperature (!) 93.9 F (34.4 C), height 6' (1.829 m), weight 270 lb (122.5 kg).  General appearance: alert and no distress Neck: no adenopathy, no carotid bruit, no JVD, supple, symmetrical, trachea midline and thyroid not enlarged, symmetric, no tenderness/mass/nodules Lungs: clear to auscultation bilaterally Heart: regular rate and rhythm, S1, S2 normal, no murmur, click, rub or gallop Extremities: extremities normal, atraumatic, no cyanosis or edema Pulses: 2+ and symmetric Skin: Skin color, texture, turgor normal. No rashes or lesions Neurologic: Alert and oriented X 3, normal strength and tone. Normal symmetric reflexes. Normal coordination and gait  EKG not performed today  ASSESSMENT AND PLAN:   History of NSTEMI- History of non-STEMI 01/24/2014 with cardiac catheterization, PCI and stenting  11/26/2014 of a 90% mid LAD lesion using a drug-eluting stent.  He had moderate ramus branch disease and normal LV function at that time.  Recent Myoview stress test showed apical thinning without ischemia.  Essential hypertension History of essential hypertension with blood pressure measured today 126/62.  He is on metoprolol and losartan.  Hyperlipidemia with target LDL less than 70 History of hyperlipidemia on high-dose statin therapy with a lipid profile performed 11/17/2019 revealing a total cholesterol of 96, LDL 52 and HDL 33.  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  He is on 37.5 mg grams of metoprolol twice daily with a resting heart rate of 56.  He also has an EF of 45 to 50%, somewhat lower than he has had in the past.  I am wondering whether his symptoms are all related to PVCs.  He is cut his coffee to  decaffeinated.  Going to refer him to Dr. Ladona Ridgel for EP evaluation.      [name] Gess MD FACP,FACC,FAHA, [facility] 02/21/2020 10:41 AM

## 2020-02-21 NOTE — Assessment & Plan Note (Signed)
History of hyperlipidemia on high-dose statin therapy with a lipid profile performed 11/17/2019 revealing a total cholesterol of 96, LDL 52 and HDL 33.

## 2020-02-21 NOTE — Assessment & Plan Note (Signed)
History of non-STEMI 01/24/2014 with cardiac catheterization, PCI and stenting 11/26/2014 of a 90% mid LAD lesion using a drug-eluting stent.  He had moderate ramus branch disease and normal LV function at that time.  Recent Myoview stress test showed apical thinning without ischemia.

## 2020-02-21 NOTE — Assessment & Plan Note (Signed)
History of essential hypertension with blood pressure measured today 126/62.  He is on metoprolol and losartan.

## 2020-02-21 NOTE — Patient Instructions (Signed)
Medication Instructions:  Your physician recommends that you continue on your current medications as directed. Please refer to the Current Medication list given to you today.  If you need a refill on your cardiac medications before your next appointment, please call your pharmacy.   Lab work: NONE  Testing/Procedures: NONE  Follow-Up: At [facility], you and your health needs are our priority.  As part of our continuing mission to provide you with exceptional heart care, we have created designated Provider Care Teams.  These Care Teams include your primary Cardiologist (physician) and Advanced Practice Providers (APPs -  Physician Assistants and Nurse Practitioners) who all work together to provide you with the care you need, when you need it. You may see Nanetta Batty, MD or one of the following Advanced Practice Providers on your designated Care Team:    [name], PA-C  [place], [place]  Edd Fabian, [place]  Your physician wants you to follow-up in: 6 months with Dr. Allyson Sabal. You will receive a reminder letter in the mail two months in advance. If you don't receive a letter, please call our office to schedule the follow-up appointment.  Any Other Special Instructions Will Be Listed Below (If Applicable). You have been referred to cardiac electrophysiology. They will be in contact.

## 2020-02-23 ENCOUNTER — Ambulatory Visit: Payer: PRIVATE HEALTH INSURANCE | Admitting: Cardiovascular Disease

## 2020-03-11 ENCOUNTER — Ambulatory Visit (INDEPENDENT_AMBULATORY_CARE_PROVIDER_SITE_OTHER): Payer: PRIVATE HEALTH INSURANCE | Admitting: Internal Medicine

## 2020-03-11 ENCOUNTER — Encounter: Payer: Self-pay | Admitting: Internal Medicine

## 2020-03-11 ENCOUNTER — Other Ambulatory Visit: Payer: Self-pay

## 2020-03-11 VITALS — BP 138/68 | HR 68 | Ht 72.0 in | Wt 274.4 lb

## 2020-03-11 DIAGNOSIS — I493 Ventricular premature depolarization: Secondary | ICD-10-CM

## 2020-03-11 NOTE — Patient Instructions (Addendum)

## 2020-03-11 NOTE — Progress Notes (Signed)
HPI Mr. [name] [name] is referred today by Dr. Allyson Sabal for evaluation of PVC's. He has a h/o HTN, DM, and dyslipidemia who was found to have CAD after a NSTEMI several years ago. His EF is 45%. He has frequent PVC's despite beta blocker therapy and a cardiac monitor showed 16%. He has minimal palpitations and has cut back his caffeine intake. He has not had syncope. He has had some worsening dyspnea. No "heart burn" which were his symptoms with his MI. No edema.   No Known Allergies   Current Outpatient Medications  Medication Sig Dispense Refill  . aspirin 81 MG chewable tablet Chew 1 tablet (81 mg total) by mouth daily.    [name] atorvastatin (LIPITOR) 80 MG tablet TAKE 1 TABLET BY MOUTH  DAILY AT 6PM 90 tablet 2  . clopidogrel (PLAVIX) 75 MG tablet TAKE 1 TABLET BY MOUTH EVERY DAY 90 tablet 3  . Insulin Glargine (LANTUS SOLOSTAR) 100 UNIT/ML Solostar Pen Inject 45 Units into the skin daily.    [name] losartan (COZAAR) 100 MG tablet Take 100 mg by mouth daily.    . metFORMIN (GLUCOPHAGE) 1000 MG tablet Take 1 tablet (1,000 mg total) by mouth 2 (two) times daily with a meal.    . metoprolol tartrate (LOPRESSOR) 25 MG tablet Take 1.5 tablets (37.5 mg total) by mouth 2 (two) times daily. 270 tablet 3  . pioglitazone (ACTOS) 45 MG tablet Take 45 mg by mouth daily.     No current facility-administered medications for this visit.     Past Medical History:  Diagnosis Date  . CAD (coronary artery disease), native coronary artery, residual RCA disease  11/27/2014  . Diabetes mellitus without complication (HCC)    type 2  . Family history of heart disease   . Hyperlipidemia   . Hypertension   . S/P angioplasty with stent mLAD 11/26/14 11/27/2014    ROS:   All systems reviewed and negative except as noted in the HPI.   Past Surgical History:  Procedure Laterality Date  . CERVICAL FUSION    . LEFT HEART CATHETERIZATION WITH CORONARY ANGIOGRAM N/A 11/26/2014   Procedure: LEFT HEART  CATHETERIZATION WITH CORONARY ANGIOGRAM;  Surgeon: Runell Gess, MD;  [facility];  Service: Cardiovascular;  Laterality: N/A;     Family History  Problem Relation Age of Onset  . Heart attack Father   . Heart attack Brother      Social History   Socioeconomic History  . Marital status: Married    Spouse name: Not on file  . Number of children: Not on file  . Years of education: Not on file  . Highest education level: Not on file  Occupational History  . Not on file  Tobacco Use  . Smoking status: Former Games developer  . Smokeless tobacco: Never Used  Substance and Sexual Activity  . Alcohol use: Yes    Comment: occasional  . Drug use: No  . Sexual activity: Not on file  Other Topics Concern  . Not on file  Social History Narrative  . Not on file   Social Determinants of Health   Financial Resource Strain:   . Difficulty of Paying Living Expenses:   Food Insecurity:   . Worried About Programme researcher, broadcasting/film/video in the Last Year:   . Barista in the Last Year:   Transportation Needs:   . Freight forwarder (Medical):   [name] Lack of Transportation (Non-Medical):   Physical  Activity:   . Days of Exercise per Week:   . Minutes of Exercise per Session:   Stress:   . Feeling of Stress :   Social Connections:   . Frequency of Communication with Friends and Family:   . Frequency of Social Gatherings with Friends and Family:   . Attends Religious Services:   . Active Member of Clubs or Organizations:   . Attends Archivist Meetings:   [name] Marital Status:   Intimate Partner Violence:   . Fear of Current or Ex-Partner:   . Emotionally Abused:   [name] Physically Abused:   . Sexually Abused:      BP 138/68   Pulse 68   Ht 6' (1.829 m)   Wt 274 lb 6.4 oz (124.5 kg)   SpO2 98%   BMI 37.22 kg/m   Physical Exam:  Overweight but well appearing man, NAD HEENT: Unremarkable Neck:  No JVD, no thyromegally Lymphatics:  No adenopathy Back:  No CVA  tenderness Lungs:  Clear with no wheezes HEART:  Regular rate rhythm, no murmurs, no rubs, no clicks Abd:  soft, positive bowel sounds, no organomegally, no rebound, no guarding Ext:  2 plus pulses, no edema, no cyanosis, no clubbing Skin:  No rashes no nodules Neuro:  CN II through XII intact, motor grossly intact  EKG - NSR with AS MI  Assess/Plan: 1. PVC's - His PVC's are much improved. He had none on his ECG today. I reviewed his cardiac monitor and prior 12 lead ECG's which demonstrate that his PVC's are originating in the superobasal region of the LV. He thinks that his symptoms have improved with uptitration of his metoprolol. If his symptoms return, I suggesting increasing his metoprolol and if that does not help or is not well tolerated a trial of mexitil. Catheter ablation would not be appropriate at this point as his burden of PVC's is not severe enough.  2. CAD -  He denies anginal symptoms.  3. LV dysfunction - His biggest complaint today was sob. I wonder if he would benefit by a switch to entresto from losartan in light of the new data suggesting Entresto better than an ACE/ARB for EF less than 55%. I'll defer to Dr. Hiram Comber. Otherwise his dyspnea is enough to consider adding a diuretic. 4. HTN - His SBP is up a bit. He needs to lose weight. We discussed the importance of a low sodium diet.  [name].D.

## 2020-09-13 ENCOUNTER — Other Ambulatory Visit: Payer: Self-pay

## 2020-09-13 ENCOUNTER — Encounter: Payer: Self-pay | Admitting: Internal Medicine

## 2020-09-13 ENCOUNTER — Ambulatory Visit (INDEPENDENT_AMBULATORY_CARE_PROVIDER_SITE_OTHER): Payer: PRIVATE HEALTH INSURANCE | Admitting: Internal Medicine

## 2020-09-13 VITALS — BP 136/80 | HR 103 | Ht 72.0 in | Wt 276.0 lb

## 2020-09-13 DIAGNOSIS — I10 Essential (primary) hypertension: Secondary | ICD-10-CM

## 2020-09-13 DIAGNOSIS — I493 Ventricular premature depolarization: Secondary | ICD-10-CM | POA: Diagnosis not present

## 2020-09-13 DIAGNOSIS — Z9861 Coronary angioplasty status: Secondary | ICD-10-CM | POA: Diagnosis not present

## 2020-09-13 DIAGNOSIS — I2510 Atherosclerotic heart disease of native coronary artery without angina pectoris: Secondary | ICD-10-CM | POA: Diagnosis not present

## 2020-09-13 MED ORDER — METOPROLOL TARTRATE 50 MG PO TABS: 50.0000 mg | ORAL_TABLET | Freq: Two times a day (BID) | ORAL | 3 refills | Status: DC

## 2020-09-13 NOTE — Progress Notes (Signed)
HPI Mr. Sem returns today for ongoing evaluation of PVC's. He has a h/o HTN, DM, and dyslipidemia who was found to have CAD after a NSTEMI several years ago. His EF is 45%. He has frequent PVC's despite beta blocker therapy and a cardiac monitor showed 16%. When I last saw the patient he was feeling better. He has been stable over the past 6 months. He is active in the yard though he rarely feels palpitations. He note several times a day, the sensation of breathlessness. He denies chest pain or lightheadedness.  No Known Allergies   Current Outpatient Medications  Medication Sig Dispense Refill  . aspirin 81 MG chewable tablet Chew 1 tablet (81 mg total) by mouth daily.    [name] atorvastatin (LIPITOR) 80 MG tablet TAKE 1 TABLET BY MOUTH  DAILY AT 6PM 90 tablet 2  . clopidogrel (PLAVIX) 75 MG tablet TAKE 1 TABLET BY MOUTH EVERY DAY 90 tablet 3  . Insulin Glargine (LANTUS SOLOSTAR) 100 UNIT/ML Solostar Pen Inject 45 Units into the skin daily.    [name] losartan (COZAAR) 100 MG tablet Take 100 mg by mouth daily.    . metFORMIN (GLUCOPHAGE) 1000 MG tablet Take 1 tablet (1,000 mg total) by mouth 2 (two) times daily with a meal.    . metoprolol tartrate (LOPRESSOR) 25 MG tablet Take 1.5 tablets (37.5 mg total) by mouth 2 (two) times daily. 270 tablet 3  . pioglitazone (ACTOS) 45 MG tablet Take 45 mg by mouth daily.     No current facility-administered medications for this visit.     Past Medical History:  Diagnosis Date  . CAD (coronary artery disease), native coronary artery, residual RCA disease  11/27/2014  . Diabetes mellitus without complication (HCC)    type 2  . Family history of heart disease   . Hyperlipidemia   . Hypertension   . S/P angioplasty with stent mLAD 11/26/14 11/27/2014    ROS:   All systems reviewed and negative except as noted in the HPI.   Past Surgical History:  Procedure Laterality Date  . CERVICAL FUSION    . LEFT HEART CATHETERIZATION WITH  CORONARY ANGIOGRAM N/A 11/26/2014   Procedure: LEFT HEART CATHETERIZATION WITH CORONARY ANGIOGRAM;  Surgeon: Runell Gess, MD;  Location: [facility] CATH LAB;  Service: Cardiovascular;  Laterality: N/A;     Family History  Problem Relation Age of Onset  . Heart attack Father   . Heart attack Brother      Social History   Socioeconomic History  . Marital status: Married    Spouse name: Not on file  . Number of children: Not on file  . Years of education: Not on file  . Highest education level: Not on file  Occupational History  . Not on file  Tobacco Use  . Smoking status: Former Games developer  . Smokeless tobacco: Never Used  Substance and Sexual Activity  . Alcohol use: Yes    Comment: occasional  . Drug use: No  . Sexual activity: Not on file  Other Topics Concern  . Not on file  Social History Narrative  . Not on file   Social Determinants of Health   Financial Resource Strain:   . Difficulty of Paying Living Expenses: Not on file  Food Insecurity:   . Worried About Programme researcher, broadcasting/film/video in the Last Year: Not on file  . Ran Out of Food in the Last Year: Not on file  Transportation Needs:   .  Lack of Transportation (Medical): Not on file  . Lack of Transportation (Non-Medical): Not on file  Physical Activity:   . Days of Exercise per Week: Not on file  . Minutes of Exercise per Session: Not on file  Stress:   . Feeling of Stress : Not on file  Social Connections:   . Frequency of Communication with Friends and Family: Not on file  . Frequency of Social Gatherings with Friends and Family: Not on file  . Attends Religious Services: Not on file  . Active Member of Clubs or Organizations: Not on file  . Attends Banker Meetings: Not on file  . Marital Status: Not on file  Intimate Partner Violence:   . Fear of Current or Ex-Partner: Not on file  . Emotionally Abused: Not on file  . Physically Abused: Not on file  . Sexually Abused: Not on file     BP  136/80   Pulse (!) 103   Ht 6' (1.829 m)   Wt 276 lb (125.2 kg)   SpO2 95%   BMI 37.43 kg/m   Physical Exam:  Well appearing NAD HEENT: Unremarkable Neck:  No JVD, no thyromegally Lymphatics:  No adenopathy Back:  No CVA tenderness Lungs:  Clear HEART:  Regular rate rhythm, no murmurs, no rubs, no clicks Abd:  soft, positive bowel sounds, no organomegally, no rebound, no guarding Ext:  2 plus pulses, no edema, no cyanosis, no clubbing Skin:  No rashes no nodules Neuro:  CN II through XII intact, motor grossly intact  EKG - nsr with PVC's   Assess/Plan: 1. PVC's - he has more today on his ECG though he is not symptomatic. His periods of brief breathlessness could be NS VT though it could be many other things. I have asked him to uptitrate his metoprolol to 50 mg twice daily. We discussed switching to toprol as well as coreg. He did not want to change. 2. LV dysfunction - he remains active working in his yard with class 1 symptoms.  3. HTN - his sbp is minimally elevated. He notes that it is usually well controlled. 4. Obesity - I encouraged him to lose weight. He is at least 50 lbs over weight.  Sharlot Gowda Slyvester Latona,MD

## 2020-09-13 NOTE — Patient Instructions (Addendum)
Medication Instructions:  Your physician has recommended you make the following change in your medication:   1.  INcrease your metoprolol tartrate--Take 50 mg by mouth twice a day  Labwork: None ordered.  Testing/Procedures: None ordered.  Follow-Up: Your physician wants you to follow-up in: 6 months with Dr. Ladona Ridgel.   You will receive a reminder letter in the mail two months in advance. If you don't receive a letter, please call our office to schedule the follow-up appointment.  Any Other Special Instructions Will Be Listed Below (If Applicable).  If you need a refill on your cardiac medications before your next appointment, please call your pharmacy.

## 2020-11-28 ENCOUNTER — Ambulatory Visit (INDEPENDENT_AMBULATORY_CARE_PROVIDER_SITE_OTHER): Payer: PRIVATE HEALTH INSURANCE

## 2020-11-28 ENCOUNTER — Other Ambulatory Visit: Payer: Self-pay | Admitting: Cardiovascular Disease

## 2020-11-28 ENCOUNTER — Encounter: Payer: Self-pay | Admitting: Cardiology

## 2020-11-28 ENCOUNTER — Other Ambulatory Visit: Payer: Self-pay

## 2020-11-28 ENCOUNTER — Ambulatory Visit (INDEPENDENT_AMBULATORY_CARE_PROVIDER_SITE_OTHER): Payer: PRIVATE HEALTH INSURANCE | Admitting: Cardiology

## 2020-11-28 VITALS — BP 156/64 | HR 64 | Ht 72.0 in | Wt 279.6 lb

## 2020-11-28 DIAGNOSIS — I10 Essential (primary) hypertension: Secondary | ICD-10-CM

## 2020-11-28 DIAGNOSIS — R0600 Dyspnea, unspecified: Secondary | ICD-10-CM

## 2020-11-28 DIAGNOSIS — I2510 Atherosclerotic heart disease of native coronary artery without angina pectoris: Secondary | ICD-10-CM | POA: Diagnosis not present

## 2020-11-28 DIAGNOSIS — Z9861 Coronary angioplasty status: Secondary | ICD-10-CM

## 2020-11-28 DIAGNOSIS — I493 Ventricular premature depolarization: Secondary | ICD-10-CM | POA: Diagnosis not present

## 2020-11-28 DIAGNOSIS — R001 Bradycardia, unspecified: Secondary | ICD-10-CM

## 2020-11-28 DIAGNOSIS — Z794 Long term (current) use of insulin: Secondary | ICD-10-CM

## 2020-11-28 DIAGNOSIS — E669 Obesity, unspecified: Secondary | ICD-10-CM | POA: Insufficient documentation

## 2020-11-28 DIAGNOSIS — E119 Type 2 diabetes mellitus without complications: Secondary | ICD-10-CM

## 2020-11-28 DIAGNOSIS — R0609 Other forms of dyspnea: Secondary | ICD-10-CM

## 2020-11-28 DIAGNOSIS — E785 Hyperlipidemia, unspecified: Secondary | ICD-10-CM

## 2020-11-28 NOTE — Assessment & Plan Note (Signed)
Evaluated by Dr Ladona Ridgel Oct 2021- treated with increased beta blocker

## 2020-11-28 NOTE — Patient Instructions (Addendum)
Medication Instructions:  DECREASE- Metoprolol Succinate 50 mg by mouth in the morning and 25 mg at bedtime  *If you need a refill on your cardiac medications before your next appointment, please call your pharmacy*   Lab Work: None Ordered   Testing/Procedures: Your physician has recommended that you wear a 7 day Zio monitor. Holter monitors are medical devices that record the heart's electrical activity. Doctors most often use these monitors to diagnose arrhythmias. Arrhythmias are problems with the speed or rhythm of the heartbeat. The monitor is a small, portable device. You can wear one while you do your normal daily activities. This is usually used to diagnose what is causing palpitations/syncope (passing out).   Follow-Up: At [facility], you and your health needs are our priority.  As part of our continuing mission to provide you with exceptional heart care, we have created designated Provider Care Teams.  These Care Teams include your primary Cardiologist (physician) and Advanced Practice Providers (APPs -  Physician Assistants and Nurse Practitioners) who all work together to provide you with the care you need, when you need it.  We recommend signing up for the patient portal called "MyChart".  Sign up information is provided on this After Visit Summary.  MyChart is used to connect with patients for Virtual Visits (Telemedicine).  Patients are able to view lab/test results, encounter notes, upcoming appointments, etc.  Non-urgent messages can be sent to your provider as well.   To learn more about what you can do with MyChart, go to [email or web address].    Your next appointment:   3-4 week(s)  The format for your next appointment:   In Person  Provider:   Dr Ladona Ridgel

## 2020-11-28 NOTE — Assessment & Plan Note (Signed)
HR 42 by me on exam- decrease PM Lopressor to 25 mg.

## 2020-11-28 NOTE — Assessment & Plan Note (Signed)
Echo Feb 2021 showed an EF of 45-50% (in bigeminy) I think his symptoms are secondary to ectopy- check 7 day ZIO since medications have been adjusted.

## 2020-11-28 NOTE — Progress Notes (Signed)
Cardiology Office Note:    Date:  11/28/2020   ID:  [name] [name], DOB 1961-03-05, MRN [id number]  PCP:  Joycelyn Rua, MD  Cardiologist:  Nanetta Batty, MD  Electrophysiologist:  None   Referring MD: Joycelyn Rua, MD   CC: SOB, palpitations, bradycardia  History of Present Illness:    [name] [name] is a 59 y.o. male with a hx of aNSTEMIin December 2015. At that time he underwent mid LAD intervention with a DES. He had some moderate residual CAD in the ramus intermedius. His LV functionwasnormal. Other medical problems include insulin-dependent diabetes, treated hypertension, and treated dyslipidemia. In Jan 2020 he did have some chest pain, a Myoview done then was low risk.  I saw him in the office 12/28/2019.  He been complaining of palpitations described as "hard heartbeats".  At that time he denied any unusual chest pain or dyspnea.  His EKG showed frequent PVCs.  He was on a beta-blocker.  He was set up for an outpatient monitor which revealed bigeminy brief runs of nonsustained VT and frequent PVCs.  He was referred to Dr Ladona Ridgel and his beta blocker increased.  He is in the office today for follow up.  He still has "breathlessness".  I think this is from PVCs.  He also notes his heart is in the 40's in the morning.  He denies any syncope or near syncope.  He has some DOE, its not clear that this is better or worse since his medication adjustment.   Past Medical History:  Diagnosis Date  . CAD (coronary artery disease), native coronary artery, residual RCA disease  11/27/2014  . Diabetes mellitus without complication (HCC)    type 2  . Family history of heart disease   . Hyperlipidemia   . Hypertension   . S/P angioplasty with stent mLAD 11/26/14 11/27/2014    Past Surgical History:  Procedure Laterality Date  . CERVICAL FUSION    . LEFT HEART CATHETERIZATION WITH CORONARY ANGIOGRAM N/A 11/26/2014   Procedure: LEFT HEART CATHETERIZATION WITH  CORONARY ANGIOGRAM;  Surgeon: Runell Gess, MD;  Location: [facility] CATH LAB;  Service: Cardiovascular;  Laterality: N/A;    Current Medications: Current Meds  Medication Sig  . aspirin 81 MG chewable tablet Chew 1 tablet (81 mg total) by mouth daily.  [name] atorvastatin (LIPITOR) 80 MG tablet TAKE 1 TABLET BY MOUTH  DAILY AT 6PM  . Insulin Glargine (LANTUS SOLOSTAR) 100 UNIT/ML Solostar Pen Inject 45 Units into the skin daily.  [name] losartan (COZAAR) 100 MG tablet Take 100 mg by mouth daily.  . metFORMIN (GLUCOPHAGE) 1000 MG tablet Take 1 tablet (1,000 mg total) by mouth 2 (two) times daily with a meal.  . pioglitazone (ACTOS) 45 MG tablet Take 45 mg by mouth daily.  . [DISCONTINUED] clopidogrel (PLAVIX) 75 MG tablet TAKE 1 TABLET BY MOUTH EVERY DAY  . [DISCONTINUED] metoprolol tartrate (LOPRESSOR) 50 MG tablet Take 1 tablet (50 mg total) by mouth 2 (two) times daily. (Patient taking differently: Take 50 mg in the morning and 25 mg at bedtime)     Allergies:   Patient has no known allergies.   Social History   Socioeconomic History  . Marital status: Married    Spouse name: Not on file  . Number of children: Not on file  . Years of education: Not on file  . Highest education level: Not on file  Occupational History  . Not on file  Tobacco Use  . Smoking status: Former Games developer  .  Smokeless tobacco: Never Used  Substance and Sexual Activity  . Alcohol use: Yes    Comment: occasional  . Drug use: No  . Sexual activity: Not on file  Other Topics Concern  . Not on file  Social History Narrative  . Not on file   Social Determinants of Health   Financial Resource Strain: Not on file  Food Insecurity: Not on file  Transportation Needs: Not on file  Physical Activity: Not on file  Stress: Not on file  Social Connections: Not on file     Family History: The patient's family history includes Heart attack in his brother and father.  ROS:   Please see the history of present illness.     He denies snoring  All other systems reviewed and are negative.  EKGs/Labs/Other Studies Reviewed:    The following studies were reviewed today:  Echo 02/08/2020- IMPRESSIONS    1. EF mildly reduced 45-50% but this is likely due to the fact that the  patient was in ventricular bigeminy during the examination. Would  recommend to recheck LVEF after treatment of PVCs.  2. Left ventricular ejection fraction, by estimation, is 45 to 50%. The  left ventricle has mildly decreased function. The left ventricle  demonstrates global hypokinesis. Left ventricular diastolic parameters are  indeterminate.  3. Right ventricular systolic function is normal. The right ventricular  size is normal. There is normal pulmonary artery systolic pressure. The  estimated right ventricular systolic pressure is 25.3 mmHg.  4. Left atrial size was mildly dilated.  5. The mitral valve is grossly normal. No evidence of mitral valve  regurgitation. No evidence of mitral stenosis.  6. The aortic valve is tricuspid. Aortic valve regurgitation is not  visualized. No aortic stenosis is present.  7. The inferior vena cava is normal in size with greater than 50%  respiratory variability, suggesting right atrial pressure of 3 mmHg.   Myoview 02/14/220-  Study was not gated due to frequent PVCs  There was no ST segment deviation noted during stress. Frequent PVCs during stress.  Defect 1: There is a small defect of moderate severity present in the apex location.  Defect 2: There is a medium defect of moderate severity present in the basal inferior, mid inferior and apical inferior location.  Defect 3: There is a small defect of mild severity present in the basal anteroseptal, mid anteroseptal and apical anterior location.  Findings consistent with prior myocardial infarction.  This is an intermediate risk study. No ischemia.     EKG:  EKG is ordered today.  The ekg ordered today demonstrates NSR-  60  Recent Labs: No results found for requested labs within last 8760 hours.  Recent Lipid Panel    Component Value Date/Time   CHOL 90 01/24/2015 0827   TRIG 47 01/24/2015 0827   HDL 34 (L) 01/24/2015 0827   CHOLHDL 2.6 01/24/2015 0827   VLDL 9 01/24/2015 0827   LDLCALC 47 01/24/2015 0827    Physical Exam:    VS:  BP (!) 156/64   Pulse 64   Ht 6' (1.829 m)   Wt 279 lb 9.6 oz (126.8 kg)   SpO2 95%   BMI 37.92 kg/m     Wt Readings from Last 3 Encounters:  11/28/20 279 lb 9.6 oz (126.8 kg)  09/13/20 276 lb (125.2 kg)  03/11/20 274 lb 6.4 oz (124.5 kg)     GEN: Overweight caucasian male, well developed in no acute distress HEENT: Normal NECK: No  JVD; No carotid bruits CARDIAC: RRR, no murmurs, rubs, gallops-rate 42 on exam RESPIRATORY:  Clear to auscultation without rales, wheezing or rhonchi  ABDOMEN: Soft, non-tender, non-distended MUSCULOSKELETAL:  No edema; No deformity  SKIN: Warm and dry NEUROLOGIC:  Alert and oriented x 3 PSYCHIATRIC:  Normal affect   ASSESSMENT:    DOE (dyspnea on exertion) Echo Feb 2021 showed an EF of 45-50% (in bigeminy) I think his symptoms are secondary to ectopy- check 7 day ZIO since medications have been adjusted.  Bradycardia HR 42 by me on exam- decrease PM Lopressor to 25 mg.  PVC's (premature ventricular contractions) Evaluated by Dr Ladona Ridgel Oct 2021- treated with increased beta blocker  Insulin dependent type 2 diabetes mellitus (HCC) No nephropathy, retinopathy, or neuropathy  Obesity (BMI 30-39.9) BMI 37-he denies any symptoms to suggest sleep apnea  Essential hypertension Fair control- Mild LVH on echo  CAD -S/P PCI 11/23/14 NSTEMI  S/P mLAD DES 11/26/14. RI with 60% stenosis, treat medically, EF 60% Myoview March 2021 low risk  PLAN:    Decrease PM lopressor to 25 mg, check 7 day ZIO to see if his symptoms corollate with ectopy. F/U with Dr Ladona Ridgel after monitor as the patient appears to have continued symptoms  despite maximal medical Rx.    Medication Adjustments/Labs and Tests Ordered: Current medicines are reviewed at length with the patient today.  Concerns regarding medicines are outlined above.  Orders Placed This Encounter  Procedures  . LONG TERM MONITOR (3-14 DAYS)   No orders of the defined types were placed in this encounter.   Patient Instructions  Medication Instructions:  DECREASE- Metoprolol Succinate 50 mg by mouth in the morning and 25 mg at bedtime  *If you need a refill on your cardiac medications before your next appointment, please call your pharmacy*   Lab Work: None Ordered   Testing/Procedures: Your physician has recommended that you wear a 7 day Zio monitor. Holter monitors are medical devices that record the heart's electrical activity. Doctors most often use these monitors to diagnose arrhythmias. Arrhythmias are problems with the speed or rhythm of the heartbeat. The monitor is a small, portable device. You can wear one while you do your normal daily activities. This is usually used to diagnose what is causing palpitations/syncope (passing out).   Follow-Up: At [facility], you and your health needs are our priority.  As part of our continuing mission to provide you with exceptional heart care, we have created designated Provider Care Teams.  These Care Teams include your primary Cardiologist (physician) and Advanced Practice Providers (APPs -  Physician Assistants and Nurse Practitioners) who all work together to provide you with the care you need, when you need it.  We recommend signing up for the patient portal called "MyChart".  Sign up information is provided on this After Visit Summary.  MyChart is used to connect with patients for Virtual Visits (Telemedicine).  Patients are able to view lab/test results, encounter notes, upcoming appointments, etc.  Non-urgent messages can be sent to your provider as well.   To learn more about what you can do with  MyChart, go to [email or web address].    Your next appointment:   3-4 week(s)  The format for your next appointment:   In Person  Provider:   Dr Ladona Ridgel        Signed, [name], PA-C  11/28/2020 10:49 AM    [facility]

## 2020-11-28 NOTE — Assessment & Plan Note (Signed)
No nephropathy, retinopathy, or neuropathy

## 2020-11-28 NOTE — Assessment & Plan Note (Signed)
11/23/14 NSTEMI  S/P mLAD DES 11/26/14. RI with 60% stenosis, treat medically, EF 60% Myoview March 2021 low risk

## 2020-11-28 NOTE — Assessment & Plan Note (Signed)
Fair control- Mild LVH on echo

## 2020-11-28 NOTE — Assessment & Plan Note (Signed)
BMI 37-he denies any symptoms to suggest sleep apnea

## 2020-12-03 DIAGNOSIS — R0600 Dyspnea, unspecified: Secondary | ICD-10-CM

## 2020-12-03 DIAGNOSIS — I493 Ventricular premature depolarization: Secondary | ICD-10-CM

## 2020-12-31 ENCOUNTER — Telehealth (INDEPENDENT_AMBULATORY_CARE_PROVIDER_SITE_OTHER): Payer: PRIVATE HEALTH INSURANCE | Admitting: Internal Medicine

## 2020-12-31 ENCOUNTER — Other Ambulatory Visit: Payer: Self-pay

## 2020-12-31 VITALS — BP 128/67 | HR 40 | Ht 72.0 in | Wt 273.0 lb

## 2020-12-31 DIAGNOSIS — I493 Ventricular premature depolarization: Secondary | ICD-10-CM | POA: Diagnosis not present

## 2020-12-31 DIAGNOSIS — E669 Obesity, unspecified: Secondary | ICD-10-CM | POA: Diagnosis not present

## 2020-12-31 NOTE — Progress Notes (Signed)
Electrophysiology TeleHealth Note   Due to national recommendations of social distancing due to COVID 19, an audio/video telehealth visit is felt to be most appropriate for this patient at this time.  See MyChart message from today for the patient's consent to telehealth for [facility].   Date:  12/31/2020   ID:  [name] [name], DOB 02-Apr-1961, MRN [id number]  Location: patient's home  Provider location: [address] [place]  Evaluation Performed: Follow-up visit  PCP:  Joycelyn Rua, MD  Cardiologist:  Nanetta Batty, MD  Electrophysiologist:  Dr Ladona Ridgel  Chief Complaint:  "I am feeling ok I guess."  History of Present Illness:    [name] [name] is a 60 y.o. male who presents via audio/video conferencing for a telehealth visit today.  Since last being seen in our clinic, the patient reports doing very well.  Today, he denies chest pain. He still has episodes of sob lasting seconds. He has occaisional palpitations. He was seen by me a few months ago and we uptitrated his beta blocker. He has undergone a repeat heart monitor which showed about 30% PVC's. No syncope. His EF is 45% by echo. He is fairly sedentary and has not lost weight.  Past Medical History:  Diagnosis Date  . CAD (coronary artery disease), native coronary artery, residual RCA disease  11/27/2014  . Diabetes mellitus without complication (HCC)    type 2  . Family history of heart disease   . Hyperlipidemia   . Hypertension   . S/P angioplasty with stent mLAD 11/26/14 11/27/2014    Past Surgical History:  Procedure Laterality Date  . CERVICAL FUSION    . LEFT HEART CATHETERIZATION WITH CORONARY ANGIOGRAM N/A 11/26/2014   Procedure: LEFT HEART CATHETERIZATION WITH CORONARY ANGIOGRAM;  Surgeon: Runell Gess, MD;  Location: [facility] C[facility] LAB;  Service: Cardiovascular;  Laterality: N/A;    Current Outpatient Medications  Medication Sig Dispense Refill  . aspirin 81 MG  chewable tablet Chew 1 tablet (81 mg total) by mouth daily.    [name] atorvastatin (LIPITOR) 80 MG tablet TAKE 1 TABLET BY MOUTH  DAILY AT 6PM 90 tablet 2  . clopidogrel (PLAVIX) 75 MG tablet TAKE 1 TABLET BY MOUTH EVERY DAY 90 tablet 3  . Insulin Glargine (LANTUS SOLOSTAR) 100 UNIT/ML Solostar Pen Inject 45 Units into the skin daily.    [name] losartan (COZAAR) 100 MG tablet Take 100 mg by mouth daily.    . metFORMIN (GLUCOPHAGE) 1000 MG tablet Take 1 tablet (1,000 mg total) by mouth 2 (two) times daily with a meal.    . metoprolol succinate (TOPROL-XL) 50 MG 24 hr tablet Take 50 mg in the morning and 25 mg at bedtime. Take with or immediately following a meal.    . pioglitazone (ACTOS) 45 MG tablet Take 45 mg by mouth daily.     No current facility-administered medications for this visit.    Allergies:   Patient has no known allergies.   Social History:  The patient  reports that he has quit smoking. He has never used smokeless tobacco. He reports current alcohol use. He reports that he does not use drugs.   Family History:  The patient's  family history includes Heart attack in his brother and father.   ROS:  Please see the history of present illness.   All other systems are personally reviewed and negative.    Exam:    Vital Signs:  BP 128/67   Pulse (!) 40  Ht 6' (1.829 m)   Wt 273 lb (123.8 kg)   BMI 37.03 kg/m   Well appearing, alert and conversant   Labs/Other Tests and Data Reviewed:    Recent Labs: No results found for requested labs within last 8760 hours.   Wt Readings from Last 3 Encounters:  12/31/20 273 lb (123.8 kg)  11/28/20 279 lb 9.6 oz (126.8 kg)  09/13/20 276 lb (125.2 kg)     Other studies personally reviewed: Cardiac monitor reviewed  ASSESSMENT & PLAN:    1.  PVC's - we discussed the treatment options in detail. Watchful waiting with continued beta blocker vs AA drug therapy, vs catheter ablation were all reviewed. Because he denied feeling bad and has  minimal palpitations, I have recommended continued watchful waiting. I encouraged him to call me for early followup if his symptoms worsen.  2. CAD - he denies anginal symptoms. 3. HTN -his bp is fairly well controlled. 4. Obesity - his bmi is 37. He is encouraged to lose weight.  Follow-up:  6 months Next remote: n/a  Current medicines are reviewed at length with the patient today.   The patient does not have concerns regarding his medicines.  The following changes were made today:  none  Labs/ tests ordered today include: none No orders of the defined types were placed in this encounter.    Patient Risk:  after full review of this patients clinical status, I feel that they are at moderate risk at this time.  Today, I have spent 22 minutes with the patient with telehealth technology discussing all of the above .    Signed, Lewayne Bunting, MD  12/31/2020 12:25 PM     [facility] [address] [place] [address] [phone] (office) [phone] (fax)

## 2021-01-07 ENCOUNTER — Ambulatory Visit (INDEPENDENT_AMBULATORY_CARE_PROVIDER_SITE_OTHER): Payer: PRIVATE HEALTH INSURANCE | Admitting: Cardiovascular Disease

## 2021-01-07 ENCOUNTER — Encounter: Payer: Self-pay | Admitting: Cardiovascular Disease

## 2021-01-07 ENCOUNTER — Other Ambulatory Visit: Payer: Self-pay

## 2021-01-07 VITALS — BP 138/68 | HR 84 | Ht 72.0 in | Wt 281.0 lb

## 2021-01-07 DIAGNOSIS — E669 Obesity, unspecified: Secondary | ICD-10-CM

## 2021-01-07 DIAGNOSIS — I252 Old myocardial infarction: Secondary | ICD-10-CM | POA: Diagnosis not present

## 2021-01-07 DIAGNOSIS — I10 Essential (primary) hypertension: Secondary | ICD-10-CM | POA: Diagnosis not present

## 2021-01-07 DIAGNOSIS — I493 Ventricular premature depolarization: Secondary | ICD-10-CM

## 2021-01-07 DIAGNOSIS — Z9861 Coronary angioplasty status: Secondary | ICD-10-CM

## 2021-01-07 DIAGNOSIS — E785 Hyperlipidemia, unspecified: Secondary | ICD-10-CM | POA: Diagnosis not present

## 2021-01-07 DIAGNOSIS — I2510 Atherosclerotic heart disease of native coronary artery without angina pectoris: Secondary | ICD-10-CM | POA: Diagnosis not present

## 2021-01-07 DIAGNOSIS — I428 Other cardiomyopathies: Secondary | ICD-10-CM | POA: Insufficient documentation

## 2021-01-07 NOTE — Assessment & Plan Note (Signed)
History of hyperlipidemia on statin therapy with lipid profile performed 11/14/2020 revealing a total cholesterol of 103, LDL of 53 and HDL 37.

## 2021-01-07 NOTE — Assessment & Plan Note (Signed)
History of non-STEMI 11/26/2014. Underwent cardiac catheterization position by myself radially revealing a 90% mid LAD lesion which I stented using a drug-eluting stent. He did have moderate ramus branch disease and normal LV function at that time. He was on DAPT with aspirin and Brilinta was transitioned to Plavix after that which she is currently on. He denies chest pain. He did have a Myoview stress test performed last year that showed apical thinning without ischemia.

## 2021-01-07 NOTE — Assessment & Plan Note (Signed)
EF by 2D echo 01/25/2020 with 45 to 50% probably due to the fact that the patient was in ventricular bigeminy.

## 2021-01-07 NOTE — Patient Instructions (Signed)

## 2021-01-07 NOTE — Assessment & Plan Note (Signed)
History of essential hypertension blood pressure measured today 138/68. He is on metoprolol and losartan.

## 2021-01-07 NOTE — Assessment & Plan Note (Signed)
History of obesity with BMI of 38. He is gained 10 pounds since I last saw him. We talked about the importance of weight reduction.

## 2021-01-07 NOTE — Progress Notes (Signed)
01/07/2021 [place]   15-Jul-1961  [id number]  Primary Physician Joycelyn Rua, MD Primary Cardiologist: Runell Gess MD [name], [place]  HPI:  Alain Deschene is a 60 y.o.  moderately overweight. 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 His primary care physician is Dr. Silvestre Moment [facility]. I last saw him in the office 02/21/2020.Marland KitchenHe has a history of hypertension, diabetes, hyperlipidemia. He also has a strong family history of heart disease with father that had an MI at age 22 and a brother who died at age 68 with a history of coronary artery disease. He's had chest pain over the last several weeks and was seen by his primary care physician on 11/14/14 with PPIs. At that time his EKG showed no acute changes. He had fairly constant chest pain most the night last night and came to the emergency room today. His troponin was greater than 6 and his EKG shows inferolateral T-wave inversion. He is being admitted for a non-STEMI 11/23/14. On 11/26/14 he underwent cardiac catheterization performed by myself radially revealing a 90% mid LAD lesion which I stented using a drug-eluting stent. He had moderate ramus branch disease and normal LV function.He was on dual antiplatelet therapy with aspirin and Brilenta which has been transition to Plavix. He's gained approximately 15 pounds since I saw him last probably as a result of diet and lack of exercise.  He saw [name] in the office complaining of some shortness of breath.  Myoview stress test showed apical thinning and a 2D echo showed EF of 45 to 50% probably related to ventricular bigeminy.  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  He is on a moderate dose of p.o. beta-blocker (37.5 mg p.o. twice daily of metoprolol) and his changes coffee to decaf.  He denies chest  pain but does complain of some dyspnea on exertion.  Since I saw him in the office close to a year ago he is remained stable. He has seen Dr. Sharrell Ku in the office 12/31/2020 who follows him for his frequent PVCs as well and recommended a watchful waiting strategy.   Current Meds  Medication Sig  . aspirin 81 MG chewable tablet Chew 1 tablet (81 mg total) by mouth daily.  [name] atorvastatin (LIPITOR) 80 MG tablet TAKE 1 TABLET BY MOUTH  DAILY AT 6PM  . clopidogrel (PLAVIX) 75 MG tablet TAKE 1 TABLET BY MOUTH EVERY DAY  . Insulin Glargine (LANTUS SOLOSTAR) 100 UNIT/ML Solostar Pen Inject 45 Units into the skin daily.  [name] losartan (COZAAR) 100 MG tablet Take 100 mg by mouth daily.  . metFORMIN (GLUCOPHAGE) 1000 MG tablet Take 1 tablet (1,000 mg total) by mouth 2 (two) times daily with a meal.  . metoprolol succinate (TOPROL-XL) 50 MG 24 hr tablet Take 50 mg in the morning and 25 mg at bedtime. Take with or immediately following a meal.  . pioglitazone (ACTOS) 45 MG tablet Take 45 mg by mouth daily.     No Known Allergies  Social History   Socioeconomic History  . Marital status: Married    Spouse name: Not on file  . Number of children: Not on file  . Years of education: Not on file  . Highest education level: Not on file  Occupational History  . Not on file  Tobacco Use  . Smoking status: Former Games developer  .  Smokeless tobacco: Never Used  Substance and Sexual Activity  . Alcohol use: Yes    Comment: occasional  . Drug use: No  . Sexual activity: Not on file  Other Topics Concern  . Not on file  Social History Narrative  . Not on file   Social Determinants of Health   Financial Resource Strain: Not on file  Food Insecurity: Not on file  Transportation Needs: Not on file  Physical Activity: Not on file  Stress: Not on file  Social Connections: Not on file  Intimate Partner Violence: Not on file     Review of Systems: General: negative for chills, fever, night sweats or  weight changes.  Cardiovascular: negative for chest pain, dyspnea on exertion, edema, orthopnea, palpitations, paroxysmal nocturnal dyspnea or shortness of breath Dermatological: negative for rash Respiratory: negative for cough or wheezing Urologic: negative for hematuria Abdominal: negative for nausea, vomiting, diarrhea, bright red blood per rectum, melena, or hematemesis Neurologic: negative for visual changes, syncope, or dizziness All other systems reviewed and are otherwise negative except as noted above.    Blood pressure 138/68, pulse 84, height 6' (1.829 m), weight 281 lb (127.5 kg).  General appearance: alert and no distress Neck: no adenopathy, no carotid bruit, no JVD, supple, symmetrical, trachea midline and thyroid not enlarged, symmetric, no tenderness/mass/nodules Lungs: clear to auscultation bilaterally Heart: regular rate and rhythm, S1, S2 normal, no murmur, click, rub or gallop Extremities: extremities normal, atraumatic, no cyanosis or edema Pulses: 2+ and symmetric Skin: Skin color, texture, turgor normal. No rashes or lesions Neurologic: Alert and oriented X 3, normal strength and tone. Normal symmetric reflexes. Normal coordination and gait  EKG sinus rhythm at 84 with frequent PVCs. I personally reviewed this EKG.  ASSESSMENT AND PLAN:   History of NSTEMI- History of non-STEMI 11/26/2014. Underwent cardiac catheterization position by myself radially revealing a 90% mid LAD lesion which I stented using a drug-eluting stent. He did have moderate ramus branch disease and normal LV function at that time. He was on DAPT with aspirin and Brilinta was transitioned to Plavix after that which she is currently on. He denies chest pain. He did have a Myoview stress test performed last year that showed apical thinning without ischemia.  Essential hypertension History of essential hypertension blood pressure measured today 138/68. He is on metoprolol and  losartan.  Hyperlipidemia with target LDL less than 70 History of hyperlipidemia on statin therapy with lipid profile performed 11/14/2020 revealing a total cholesterol of 103, LDL of 53 and HDL 37.  Obesity (BMI 30-39.9) History of obesity with BMI of 38. He is gained 10 pounds since I last saw him. We talked about the importance of weight reduction.  PVC's (premature ventricular contractions) History of frequent PVCs followed by Dr. Ladona Ridgel on beta-blockade. At this point, Dr. Ladona Ridgel favored conservative watch and wait strategy.  Nonischemic cardiomyopathy (HCC) EF by 2D echo 01/25/2020 with 45 to 50% probably due to the fact that the patient was in ventricular bigeminy.      Runell Gess MD FACP,FACC,FAHA, [facility] 01/07/2021 8:59 AM

## 2021-01-07 NOTE — Assessment & Plan Note (Signed)
History of frequent PVCs followed by Dr. Ladona Ridgel on beta-blockade. At this point, Dr. Ladona Ridgel favored conservative watch and wait strategy.

## 2021-02-09 ENCOUNTER — Other Ambulatory Visit: Payer: Self-pay | Admitting: Cardiology

## 2021-04-17 ENCOUNTER — Telehealth: Payer: Self-pay | Admitting: Cardiovascular Disease

## 2021-04-17 NOTE — Telephone Encounter (Signed)
   Patient Name: [name] [name]  DOB: 1961-04-19  MRN: [id number]   Primary Cardiologist: Nanetta Batty, MD  Chart reviewed as part of pre-operative protocol coverage. Patient was last seen by Dr. Allyson Sabal in 12/2020. Patient was contacted today for further pre-op evaluation and reported doing well from a cardiac standpoint. Simple dental extractions are considered low risk procedures per guidelines and generally do not require any specific cardiac clearance. It is also generally accepted that for simple extractions and dental cleanings, there is no need to interrupt blood thinner therapy. However, Dr. Allyson Sabal has previously given his OK to hold antiplatelet agents prior to dental procedures. Therefore, OK to hold Aspirin and Plavix as needed prior to procedure. Please restart both as soon as able following procedure.  SBE prophylaxis is not required for the patient from a cardiac standpoint.  I will route this recommendation to the requesting party via Epic fax function and remove from pre-op pool.  Please call with questions.  Corrin Parker, PA-C 04/17/2021, 11:56 AM

## 2021-04-17 NOTE — Telephone Encounter (Signed)
   [facility] Pre-operative Risk Assessment    Patient Name: [name] [name]  DOB: 1961-04-29  MRN: [id number]   HEARTCARE STAFF: - Please ensure there is not already an duplicate clearance open for this procedure. - Under Visit Info/Reason for Call, type in Other and utilize the format Clearance MM/DD/YY or Clearance TBD. Do not use dashes or single digits. - If request is for dental extraction, please clarify the # of teeth to be extracted.  Request for surgical clearance:  1. What type of surgery is being performed? BONE GRAFTING AND 1 TOOTH TO BE EXTRACTED, 2 CROWNS   2. When is this surgery scheduled? 04/23/21   3. What type of clearance is required (medical clearance vs. Pharmacy clearance to hold med vs. Both)? MEDICAL  4. Are there any medications that need to be held prior to surgery and how long? ASA AND PLAVIX    5. Practice name and name of physician performing surgery? DR. Lavonne Chick, DDS   6. What is the office phone number? [phone]   7.   What is the office fax number? [phone]  8.   Anesthesia type (None, local, MAC, general) ? TRIAZOLAM 0.25 MG   Julaine Hua 04/17/2021, 10:30 AM  _________________________________________________________________   (provider comments below)

## 2021-04-17 NOTE — Telephone Encounter (Signed)
   Received a follow-up call in regards to this clearance. Anticipating a possible surgical conversion for his upcoming dental work. As such, recommend holding aspirin and plavix 5-7 days prior to his upcoming procedure. Aspirin and plavix should be restarted as soon as he is cleared to do so by his dentist.  Beatriz Stallion, PA-C 04/17/21; 4:06 PM

## 2021-04-17 NOTE — Telephone Encounter (Signed)
Darl Pikes from [facility] called and wanted to talk with preop about patient's medical clearance that was sent. Patient is having surgery on 04/23/21.

## 2021-07-17 ENCOUNTER — Other Ambulatory Visit: Payer: Self-pay | Admitting: Internal Medicine

## 2021-11-27 ENCOUNTER — Other Ambulatory Visit: Payer: Self-pay | Admitting: Cardiovascular Disease

## 2021-12-20 ENCOUNTER — Encounter: Payer: Self-pay | Admitting: Cardiovascular Disease

## 2022-01-14 ENCOUNTER — Ambulatory Visit (INDEPENDENT_AMBULATORY_CARE_PROVIDER_SITE_OTHER): Payer: PRIVATE HEALTH INSURANCE | Admitting: Cardiovascular Disease

## 2022-01-14 ENCOUNTER — Other Ambulatory Visit: Payer: Self-pay

## 2022-01-14 ENCOUNTER — Encounter: Payer: Self-pay | Admitting: Cardiovascular Disease

## 2022-01-14 VITALS — BP 146/72 | HR 71 | Ht 72.0 in | Wt 275.0 lb

## 2022-01-14 DIAGNOSIS — I10 Essential (primary) hypertension: Secondary | ICD-10-CM | POA: Diagnosis not present

## 2022-01-14 DIAGNOSIS — I252 Old myocardial infarction: Secondary | ICD-10-CM | POA: Diagnosis not present

## 2022-01-14 DIAGNOSIS — I2510 Atherosclerotic heart disease of native coronary artery without angina pectoris: Secondary | ICD-10-CM | POA: Diagnosis not present

## 2022-01-14 DIAGNOSIS — E785 Hyperlipidemia, unspecified: Secondary | ICD-10-CM

## 2022-01-14 DIAGNOSIS — Z9861 Coronary angioplasty status: Secondary | ICD-10-CM

## 2022-01-14 DIAGNOSIS — I493 Ventricular premature depolarization: Secondary | ICD-10-CM

## 2022-01-14 DIAGNOSIS — I428 Other cardiomyopathies: Secondary | ICD-10-CM

## 2022-01-14 NOTE — Assessment & Plan Note (Signed)
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  I catheterized him through the right radial approach revealing a 90% mid LAD lesion which I stented using drug-eluting stent.  He had moderate ramus branch disease and normal LV function.  He is completely asymptomatic.  He had a Myoview stress test performed 02/14/2020 that showed apical thinning without ischemia.  He denies chest pain or shortness of breath.  He remains on dual antiplatelet therapy.

## 2022-01-14 NOTE — Patient Instructions (Signed)

## 2022-01-14 NOTE — Assessment & Plan Note (Signed)
History of hyperlipidemia on high-dose statin therapy with lipid profile performed 11/14/2020 revealed a total cholesterol 103, LDL 53 HDL 37 followed by his PCP.

## 2022-01-14 NOTE — Assessment & Plan Note (Signed)
History of nonischemic cardiomyopathy with an EF of 45 to 50% thought to be related to a high PVC burden currently on beta-blocker and an ARB.

## 2022-01-14 NOTE — Progress Notes (Signed)
01/14/2022 [name] [name]   [birth year]  [id number]  Primary Physician [name] Melter, MD Primary Cardiologist: [name] Harp MD Lupe Carney, [place]  HPI:  [name] [name] is a 61 y.o.  moderately overweight. 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  He does participate in the fracture market in [place] and [place].   his primary care physician is [name] [facility]. I last saw him in the office  02/21/2020.[name] KitchenHe has a history of hypertension, diabetes, hyperlipidemia. He also has a strong family history of heart disease with father that had an MI at age 83 and a brother who died at age 88 with a history of coronary artery disease. He's had chest pain over the last several weeks and was seen by his primary care physician on 11/14/14 with PPIs. At that time his EKG showed no acute changes. He had fairly constant chest pain most the night last night and came to the emergency room today. His troponin was greater than 6 and his EKG shows inferolateral T-wave inversion. He is being admitted for a non-STEMI 11/23/14. On 11/26/14 he underwent cardiac catheterization performed by myself radially revealing a 90% mid LAD lesion which I stented using a drug-eluting stent. He had moderate ramus branch disease and normal LV function.  He was on dual antiplatelet therapy with aspirin and Brilenta which has been transition to Plavix. He's gained approximately 15 pounds since I saw him last probably as a result of diet and lack of exercise.   He saw [name] [name] in the office complaining of some shortness of breath.  Myoview stress test showed apical thinning and a 2D echo showed EF of 45 to 50% probably related to ventricular bigeminy.  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  He is  on a moderate dose of p.o. beta-blocker (37.5 mg p.o. twice daily of metoprolol) and his changes coffee to decaf.  He denies chest pain but does complain of some dyspnea on exertion.   Because of his frequent PVCs found on event monitoring I referred him to Dr. Crissie Sickles.  Dr. Lovena Le saw him in the office 12/31/2020 for his frequent PVCs as well and recommended a watchful waiting strategy.  Since I saw him a year ago he remained stable.  He is asymptomatic other than the PVCs which he continues to have.  He drinks a large cup of half caffeinated coffee a day.  We talked about weaning himself off caffeine as an experiment to see if this affects the frequency and/or severity of his PVCs.     Current Meds  Medication Sig   aspirin 81 MG chewable tablet Chew 1 tablet (81 mg total) by mouth daily.   atorvastatin (LIPITOR) 80 MG tablet TAKE 1 TABLET BY MOUTH  DAILY AT 6PM   clopidogrel (PLAVIX) 75 MG tablet TAKE 1 TABLET BY MOUTH EVERY DAY   Insulin Glargine (LANTUS SOLOSTAR) 100 UNIT/ML Solostar Pen Inject 45 Units into the skin daily.   losartan (COZAAR) 100 MG tablet Take 100 mg by mouth daily.   metFORMIN (GLUCOPHAGE) 1000 MG tablet Take 1 tablet (1,000 mg total) by mouth 2 (two) times daily with a meal.   metoprolol tartrate (LOPRESSOR) 50 MG tablet TAKE 1 TABLET BY MOUTH  TWICE DAILY (Patient taking differently: Take  50 mg by mouth daily.)   pioglitazone (ACTOS) 45 MG tablet Take 45 mg by mouth daily.   [DISCONTINUED] metoprolol succinate (TOPROL-XL) 50 MG 24 hr tablet Take 50 mg in the morning and 25 mg at bedtime. Take with or immediately following a meal.     No Known Allergies  Social History   Socioeconomic History   Marital status: Married    Spouse name: Not on file   Number of children: Not on file   Years of education: Not on file   Highest education level: Not on file  Occupational History   Not on file  Tobacco Use   Smoking status: Former   Smokeless tobacco: Never   Substance and Sexual Activity   Alcohol use: Yes    Comment: occasional   Drug use: No   Sexual activity: Not on file  Other Topics Concern   Not on file  Social History Narrative   Not on file   Social Determinants of Health   Financial Resource Strain: Not on file  Food Insecurity: Not on file  Transportation Needs: Not on file  Physical Activity: Not on file  Stress: Not on file  Social Connections: Not on file  Intimate Partner Violence: Not on file     Review of Systems: General: negative for chills, fever, night sweats or weight changes.  Cardiovascular: negative for chest pain, dyspnea on exertion, edema, orthopnea, palpitations, paroxysmal nocturnal dyspnea or shortness of breath Dermatological: negative for rash Respiratory: negative for cough or wheezing Urologic: negative for hematuria Abdominal: negative for nausea, vomiting, diarrhea, bright red blood per rectum, melena, or hematemesis Neurologic: negative for visual changes, syncope, or dizziness All other systems reviewed and are otherwise negative except as noted above.    Blood pressure (!) 146/72, pulse 71, height 6' (1.829 m), weight 275 lb (124.7 kg), SpO2 96 %.  General appearance: alert and no distress Neck: no adenopathy, no carotid bruit, no JVD, supple, symmetrical, trachea midline, and thyroid not enlarged, symmetric, no tenderness/mass/nodules Lungs: clear to auscultation bilaterally Heart: regular rate and rhythm, S1, S2 normal, no murmur, click, rub or gallop Extremities: extremities normal, atraumatic, no cyanosis or edema Pulses: 2+ and symmetric Skin: Skin color, texture, turgor normal. No rashes or lesions Neurologic: Grossly normal  EKG this rhythm at 71 without ST or T wave changes.  I personally reviewed this EKG.  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  I catheterized him through the right radial approach revealing a 90% mid LAD lesion which I  stented using drug-eluting stent.  He had moderate ramus branch disease and normal LV function.  He is completely asymptomatic.  He had a Myoview stress test performed 02/14/2020 that showed apical thinning without ischemia.  He denies chest pain or shortness of breath.  He remains on dual antiplatelet therapy.  Essential hypertension History of essential hypertension her blood pressure measured today at 146/72.  He is on losartan and metoprolol.  Hyperlipidemia with target LDL less than 70 History of hyperlipidemia on high-dose statin therapy with lipid profile performed 11/14/2020 revealed a total cholesterol 103, LDL 53 HDL 37 followed by his PCP.  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  He did have a 2D echo performed 02/08/2020 revealed an EF of 45 to 50% potentially related to his frequent PVCs.  He saw Dr. Crissie Sickles for evaluation of this who recommended watchful  waiting but did offer him antiarrhythmic medications and/or a PVC ablation.  He does admit to drinking a large cup of half caf coffee a day.  We talked about caffeine avoidance.  He is on a beta-blocker as well.  Nonischemic cardiomyopathy (HCC) History of nonischemic cardiomyopathy with an EF of 45 to 50% thought to be related to a high PVC burden currently on beta-blocker and an ARB.     [name] Harp MD FACP,FACC,FAHA, [facility] 01/14/2022 9:41 AM

## 2022-01-14 NOTE — Assessment & Plan Note (Signed)
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  He did have a 2D echo performed 02/08/2020 revealed an EF of 45 to 50% potentially related to his frequent PVCs.  He saw Dr. Sharrell Ku for evaluation of this who recommended watchful waiting but did offer him antiarrhythmic medications and/or a PVC ablation.  He does admit to drinking a large cup of half caf coffee a day.  We talked about caffeine avoidance.  He is on a beta-blocker as well.

## 2022-01-14 NOTE — Assessment & Plan Note (Signed)
History of essential hypertension her blood pressure measured today at 146/72.  He is on losartan and metoprolol.

## 2022-02-23 ENCOUNTER — Telehealth: Payer: Self-pay | Admitting: *Deleted

## 2022-02-23 NOTE — Telephone Encounter (Signed)
? ?  Pre-operative Risk Assessment  ?  ?Patient Name: [name] [name]  ?DOB: [birth year] ?MRN: [id number]  ? ? ? ?Request for Surgical Clearance   ? ?Procedure:  Dental Extraction - Amount of Teeth to be Pulled:  1 TOOTH FOR EXTRACTION ? ?Date of Surgery:  Clearance TBD                              ?   ?Surgeon:  DR. MATTHEW DeVANEY ?Surgeon's Group or Practice Name:  [facility] ?Phone number:  [phone] ?Fax number:  [phone] ?  ?Type of Clearance Requested:   ?- Medical  ?- Pharmacy:  Hold Aspirin and Clopidogrel (Plavix)   ?  ?Type of Anesthesia:  Local WITH EPI ?  ?Additional requests/questions:   ? ?Signed, ?Danielle Rankin   ?02/23/2022, 2:41 PM  ? ?

## 2022-02-24 NOTE — Telephone Encounter (Signed)
? ?  Patient Name: [name] [name]  ?DOB: 06/24/1961 ?MRN: [id number] ? ?Primary Cardiologist: Nanetta Batty, MD ? ?Chart reviewed as part of pre-operative protocol coverage.  ? ?Simple (1-2 teeth) dental extractions are considered low risk procedures per guidelines and generally do not require any specific cardiac clearance. It is also generally accepted that for simple extractions and dental cleanings, there is no need to interrupt blood thinner therapy. ? ?While we typically do not recommend holding the blood thinners or antiplatelet for single teeth extraction, however if the surgical extraction is more extensive and that the risk of bleeding is high, may hold Plavix for 5 to 7 days prior to the procedure and restart as soon as possible afterward at the surgeon's discretion.  Patient was given the same recommendation during the previous cardiac clearance in May 2022. ? ?SBE prophylaxis is not required for the patient from a cardiac standpoint. ? ?I will route this recommendation to the requesting party via Epic fax function and remove from pre-op pool. ? ?Please call with questions. ? ?Azalee Course, [place] ?02/24/2022, 9:17 AM  ?

## 2022-06-24 ENCOUNTER — Other Ambulatory Visit: Payer: Self-pay | Admitting: Cardiovascular Disease

## 2022-12-16 ENCOUNTER — Other Ambulatory Visit: Payer: Self-pay | Admitting: Cardiovascular Disease

## 2023-02-02 ENCOUNTER — Ambulatory Visit: Payer: PRIVATE HEALTH INSURANCE | Attending: Cardiovascular Disease | Admitting: Cardiovascular Disease

## 2023-02-02 ENCOUNTER — Encounter: Payer: Self-pay | Admitting: Cardiovascular Disease

## 2023-02-02 VITALS — BP 150/82 | HR 66 | Ht 72.0 in | Wt 270.8 lb

## 2023-02-02 DIAGNOSIS — Z9861 Coronary angioplasty status: Secondary | ICD-10-CM | POA: Diagnosis not present

## 2023-02-02 DIAGNOSIS — I2510 Atherosclerotic heart disease of native coronary artery without angina pectoris: Secondary | ICD-10-CM

## 2023-02-02 DIAGNOSIS — E785 Hyperlipidemia, unspecified: Secondary | ICD-10-CM | POA: Diagnosis not present

## 2023-02-02 DIAGNOSIS — I428 Other cardiomyopathies: Secondary | ICD-10-CM

## 2023-02-02 DIAGNOSIS — I10 Essential (primary) hypertension: Secondary | ICD-10-CM | POA: Diagnosis not present

## 2023-02-02 DIAGNOSIS — I252 Old myocardial infarction: Secondary | ICD-10-CM | POA: Diagnosis not present

## 2023-02-02 DIAGNOSIS — I493 Ventricular premature depolarization: Secondary | ICD-10-CM

## 2023-02-02 NOTE — Patient Instructions (Signed)
Medication Instructions:  Your physician recommends that you continue on your current medications as directed. Please refer to the Current Medication list given to you today.  *If you need a refill on your cardiac medications before your next appointment, please call your pharmacy*   Follow-Up: At [facility], you and your health needs are our priority.  As part of our continuing mission to provide you with exceptional heart care, we have created designated Provider Care Teams.  These Care Teams include your primary Cardiologist (physician) and Advanced Practice Providers (APPs -  Physician Assistants and Nurse Practitioners) who all work together to provide you with the care you need, when you need it.  We recommend signing up for the patient portal called "MyChart".  Sign up information is provided on this After Visit Summary.  MyChart is used to connect with patients for Virtual Visits (Telemedicine).  Patients are able to view lab/test results, encounter notes, upcoming appointments, etc.  Non-urgent messages can be sent to your provider as well.   To learn more about what you can do with MyChart, go to [email or web address].    Your next appointment:   12 month(s)  Provider:   Jonathan Berry, MD    

## 2023-02-02 NOTE — Assessment & Plan Note (Signed)
History of essential hypertension blood pressure measured today at 150/82.  He is on losartan and metoprolol.

## 2023-02-02 NOTE — Assessment & Plan Note (Signed)
History of non-STEMI status post cardiac catheterization performed by myself radially 11/26/2014 revealing 90% mid LAD lesion which I stented using a drug-eluting stent.  He did have moderate ramus branch disease with normal LV function.  He was treated with aspirin and Brilinta and remains on dual antiplatelet therapy currently with clopidogrel.  He complains of atypical right-sided sharp chest pain which does not sound ischemically mediated.

## 2023-02-02 NOTE — Progress Notes (Signed)
02/02/2023 [name] [name]   1961-07-11  [id number]  Primary Physician [name] Melter, MD Primary Cardiologist: [name] Harp MD [name] [name], [place]  HPI:  [name] [name] is a 62 y.o.  moderately overweight. 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  He does participate in the fracture market in [place] and [place].   his primary care physician is [name] [name] [facility]. I last saw him in the office 01/14/2022.[name] KitchenHe has a history of hypertension, diabetes, hyperlipidemia. He also has a strong family history of heart disease with father that had an MI at age 49 and a brother who died at age 39 with a history of coronary artery disease. He's had chest pain over the last several weeks and was seen by his primary care physician on 11/14/14 with PPIs. At that time his EKG showed no acute changes. He had fairly constant chest pain most the night last night and came to the emergency room today. His troponin was greater than 6 and his EKG shows inferolateral T-wave inversion. He is being admitted for a non-STEMI 11/23/14. On 11/26/14 he underwent cardiac catheterization performed by myself radially revealing a 90% mid LAD lesion which I stented using a drug-eluting stent. He had moderate ramus branch disease and normal LV function.  He was on dual antiplatelet therapy with aspirin and Brilenta which has been transition to Plavix. He's gained approximately 15 pounds since I saw him last probably as a result of diet and lack of exercise.   He saw [name] [name] in the office complaining of some shortness of breath.  Myoview stress test showed apical thinning and a 2D echo showed EF of 45 to 50% probably related to ventricular bigeminy.  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  He is  on a moderate dose of p.o. beta-blocker (37.5 mg p.o. twice daily of metoprolol) and his changes coffee to decaf.  He denies chest pain but does complain of some dyspnea on exertion.   Because of his frequent PVCs found on event monitoring I referred him to Dr. Crissie Sickles.  Dr. Lovena Le saw him in the office 12/31/2020 for his frequent PVCs as well and recommended a watchful waiting strategy.  Since I saw him a year ago he remained stable.  He is completely asymptomatic except for atypical right-sided sharp fleeting chest pain which does not sound ischemically mediated.   Current Meds  Medication Sig   aspirin 81 MG chewable tablet Chew 1 tablet (81 mg total) by mouth daily.   atorvastatin (LIPITOR) 80 MG tablet TAKE 1 TABLET BY MOUTH  DAILY AT 6PM   clopidogrel (PLAVIX) 75 MG tablet TAKE 1 TABLET BY MOUTH EVERY DAY   Insulin Glargine (LANTUS SOLOSTAR) 100 UNIT/ML Solostar Pen Inject 45 Units into the skin daily.   losartan (COZAAR) 100 MG tablet Take 100 mg by mouth daily.   metFORMIN (GLUCOPHAGE) 1000 MG tablet Take 1 tablet (1,000 mg total) by mouth 2 (two) times daily with a meal.   metoprolol tartrate (LOPRESSOR) 25 MG tablet 1 tablet with food   metoprolol tartrate (LOPRESSOR) 50 MG tablet TAKE 1 TABLET BY MOUTH  TWICE DAILY   pioglitazone (ACTOS) 45 MG tablet Take 45 mg by mouth daily.     No Known Allergies  Social History  Socioeconomic History   Marital status: Married    Spouse name: Not on file   Number of children: Not on file   Years of education: Not on file   Highest education level: Not on file  Occupational History   Not on file  Tobacco Use   Smoking status: Former   Smokeless tobacco: Never  Substance and Sexual Activity   Alcohol use: Yes    Comment: occasional   Drug use: No   Sexual activity: Not on file  Other Topics Concern   Not on file  Social History Narrative   Not on file   Social Determinants of Health   Financial Resource Strain: Not on  file  Food Insecurity: Not on file  Transportation Needs: Not on file  Physical Activity: Not on file  Stress: Not on file  Social Connections: Not on file  Intimate Partner Violence: Not on file     Review of Systems: General: negative for chills, fever, night sweats or weight changes.  Cardiovascular: negative for chest pain, dyspnea on exertion, edema, orthopnea, palpitations, paroxysmal nocturnal dyspnea or shortness of breath Dermatological: negative for rash Respiratory: negative for cough or wheezing Urologic: negative for hematuria Abdominal: negative for nausea, vomiting, diarrhea, bright red blood per rectum, melena, or hematemesis Neurologic: negative for visual changes, syncope, or dizziness All other systems reviewed and are otherwise negative except as noted above.    Blood pressure (!) 150/82, pulse 66, height 6' (1.829 m), weight 270 lb 12.8 oz (122.8 kg), SpO2 97 %.  General appearance: alert and no distress Neck: no adenopathy, no carotid bruit, no JVD, supple, symmetrical, trachea midline, and thyroid not enlarged, symmetric, no tenderness/mass/nodules Lungs: clear to auscultation bilaterally Heart: regular rate and rhythm, S1, S2 normal, no murmur, click, rub or gallop Extremities: extremities normal, atraumatic, no cyanosis or edema Pulses: 2+ and symmetric Skin: Skin color, texture, turgor normal. No rashes or lesions Neurologic: Grossly normal  EKG sinus rhythm at 66 without ST or T wave changes.  Personally reviewed this EKG.  ASSESSMENT AND PLAN:   History of NSTEMI- History of non-STEMI status post cardiac catheterization performed by myself radially 11/26/2014 revealing 90% mid LAD lesion which I stented using a drug-eluting stent.  He did have moderate ramus branch disease with normal LV function.  He was treated with aspirin and Brilinta and remains on dual antiplatelet therapy currently with clopidogrel.  He complains of atypical right-sided sharp  chest pain which does not sound ischemically mediated.  Essential hypertension History of essential hypertension blood pressure measured today at 150/82.  He is on losartan and metoprolol.  Hyperlipidemia with target LDL less than 70 History of hyperlipidemia on high-dose statin therapy with recent lipid profile performed by his PCP 01/08/2023 revealing cholesterol 112, LDL 59 and HDL 41.  PVC's (premature ventricular contractions) Has frequent PVCs with an event monitor performed 11/28/2020 revealing a PVC burden of 30%.  I did refer him to Dr. Lovena Le for this who recommended watchful waiting strategy on beta-blockade.  He is unaware of the PVCs.  He was drinking a large amount of caffeine on a daily basis.  Nonischemic cardiomyopathy ([place]) History of nonischemic cardiomyopathy with echo performed 02/08/2020 revealing an EF of 45 to 50% with mild global hypokinesia and no valvular abnormalities.  He is on appropriate pharmacology for this.  He is completely asymptomatic.  The thought was this could have been PVC mediated.     [name] Harp MD FACP,FACC,FAHA, [facility] 02/02/2023 9:35 AM

## 2023-02-02 NOTE — Assessment & Plan Note (Signed)
History of hyperlipidemia on high-dose statin therapy with recent lipid profile performed by his PCP 01/08/2023 revealing cholesterol 112, LDL 59 and HDL 41.

## 2023-02-02 NOTE — Assessment & Plan Note (Signed)
History of nonischemic cardiomyopathy with echo performed 02/08/2020 revealing an EF of 45 to 50% with mild global hypokinesia and no valvular abnormalities.  He is on appropriate pharmacology for this.  He is completely asymptomatic.  The thought was this could have been PVC mediated.

## 2023-02-02 NOTE — Assessment & Plan Note (Signed)
Has frequent PVCs with an event monitor performed 11/28/2020 revealing a PVC burden of 30%.  I did refer him to Dr. Lovena Le for this who recommended watchful waiting strategy on beta-blockade.  He is unaware of the PVCs.  He was drinking a large amount of caffeine on a daily basis.

## 2023-03-05 ENCOUNTER — Other Ambulatory Visit: Payer: Self-pay | Admitting: *Deleted

## 2023-03-05 DIAGNOSIS — M5416 Radiculopathy, lumbar region: Secondary | ICD-10-CM

## 2023-06-28 ENCOUNTER — Telehealth: Payer: Self-pay | Admitting: Internal Medicine

## 2023-06-28 ENCOUNTER — Encounter: Payer: Self-pay | Admitting: Cardiovascular Disease

## 2023-06-28 NOTE — Telephone Encounter (Signed)
error 

## 2023-06-28 NOTE — Telephone Encounter (Signed)
Spoke with pt. Patient wanted to schedule a appointment with Dr. Allyson Sabal at [facility] for his 1 year follow up. Pt wanted to set it up early this year as his appointments have been getting pushed back.

## 2023-06-28 NOTE — Telephone Encounter (Signed)
  Per MyChart scheduling message:  Patient c/o Palpitations:  High priority if patient c/o lightheadedness, shortness of breath, or chest pain  How long have you had palpitations/irregular HR/ Afib? Are you having the symptoms now?   Are you currently experiencing lightheadedness, SOB or CP?   Do you have a history of afib (atrial fibrillation) or irregular heart rhythm?   Have you checked your BP or HR? (document readings if available):   Are you experiencing any other symptoms?     1. Palpitations come and go. They have been more noticeable during the last month or so.  Symptoms appear daily but at different times. 2. No - none of those experiences are occurring  3. Elevated PVCs have been documented buy Dr. Lewayne Bunting, referred to by Dr. Allyson Sabal 4. BP has been 150-160/75-85. pulse ranged from 70-85 5. no other symptoms

## 2023-09-13 ENCOUNTER — Other Ambulatory Visit: Payer: Self-pay | Admitting: Cardiovascular Disease

## 2023-10-06 ENCOUNTER — Ambulatory Visit: Payer: PRIVATE HEALTH INSURANCE | Attending: Cardiovascular Disease | Admitting: Cardiovascular Disease

## 2023-10-06 ENCOUNTER — Encounter: Payer: Self-pay | Admitting: Cardiovascular Disease

## 2023-10-06 VITALS — BP 134/70 | HR 66 | Ht 72.0 in | Wt 266.0 lb

## 2023-10-06 DIAGNOSIS — I2510 Atherosclerotic heart disease of native coronary artery without angina pectoris: Secondary | ICD-10-CM

## 2023-10-06 DIAGNOSIS — I252 Old myocardial infarction: Secondary | ICD-10-CM

## 2023-10-06 DIAGNOSIS — Z9861 Coronary angioplasty status: Secondary | ICD-10-CM

## 2023-10-06 DIAGNOSIS — I428 Other cardiomyopathies: Secondary | ICD-10-CM

## 2023-10-06 DIAGNOSIS — I10 Essential (primary) hypertension: Secondary | ICD-10-CM | POA: Diagnosis not present

## 2023-10-06 DIAGNOSIS — I493 Ventricular premature depolarization: Secondary | ICD-10-CM

## 2023-10-06 DIAGNOSIS — R002 Palpitations: Secondary | ICD-10-CM

## 2023-10-06 DIAGNOSIS — E785 Hyperlipidemia, unspecified: Secondary | ICD-10-CM

## 2023-10-06 NOTE — Progress Notes (Signed)
10/06/2023 [name] [name]   [birth year]  [id number]  Primary Physician [name] Rua, MD Primary Cardiologist: [name] Gess MD [name] [name], [place]  HPI:  [name] [name] is a 62 y.o.    moderately overweight. Caucasian male father of one son went to [employer] and who currently is a Sport and exercise psychologist in [place].  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  He does participate in the furniture market in [employer] and [place].   his primary care physician is Dr. Silvestre [name] [facility]. I last saw him in the office 02/02/2023.Marland KitchenHe has a history of hypertension, diabetes, hyperlipidemia. He also has a strong family history of heart disease with father that had an MI at age 80 and a brother who died at age 48 with a history of coronary artery disease. He's had chest pain over the last several weeks and was seen by his primary care physician on 11/14/14 with PPIs. At that time his EKG showed no acute changes. He had fairly constant chest pain most the night last night and came to the emergency room today. His troponin was greater than 6 and his EKG shows inferolateral T-wave inversion. He is being admitted for a non-STEMI 11/23/14. On 11/26/14 he underwent cardiac catheterization performed by myself radially revealing a 90% mid LAD lesion which I stented using a drug-eluting stent. He had moderate ramus branch disease and normal LV function.  He was on dual antiplatelet therapy with aspirin and Brilenta which has been transition to Plavix. He's gained approximately 15 pounds since I saw him last probably as a result of diet and lack of exercise.   He saw [name] [name] in the office complaining of some shortness of breath.  Myoview stress test showed apical thinning and a 2D echo showed EF of 45 to 50% probably related to ventricular bigeminy.  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   He is on a moderate dose of p.o. beta-blocker (37.5 mg p.o. twice daily of metoprolol) and his changes coffee to decaf.  He denies chest pain but does complain of some dyspnea on exertion.   Because of his frequent PVCs found on event monitoring I referred him to Dr. Sharrell [name].  Dr. Ladona [name] saw him in the office 12/31/2020 for his frequent PVCs as well and recommended a watchful waiting strategy.  Since I saw him a year ago he remained stable.  He is completely asymptomatic except for daily palpitations attributable to his PVCs.  Current Meds  Medication Sig   aspirin 81 MG chewable tablet Chew 1 tablet (81 mg total) by mouth daily.   atorvastatin (LIPITOR) 80 MG tablet TAKE 1 TABLET BY MOUTH  DAILY AT 6PM   clopidogrel (PLAVIX) 75 MG tablet TAKE 1 TABLET BY MOUTH EVERY DAY   Insulin Glargine (LANTUS SOLOSTAR) 100 UNIT/ML Solostar Pen Inject 45 Units into the skin daily.   losartan (COZAAR) 100 MG tablet Take 100 mg by mouth daily.   metFORMIN (GLUCOPHAGE) 1000 MG tablet Take 1 tablet (1,000 mg total) by mouth 2 (two) times daily with a meal.   metoprolol tartrate (LOPRESSOR) 50 MG tablet TAKE 1 TABLET BY MOUTH  TWICE DAILY   pioglitazone (ACTOS) 45 MG tablet Take 45 mg by mouth daily.     No Known Allergies  Social History   Socioeconomic History   Marital status: Married    Spouse name: Not on file  Number of children: Not on file   Years of education: Not on file   Highest education level: Not on file  Occupational History   Not on file  Tobacco Use   Smoking status: Former   Smokeless tobacco: Never  Substance and Sexual Activity   Alcohol use: Yes    Comment: occasional   Drug use: No   Sexual activity: Not on file  Other Topics Concern   Not on file  Social History Narrative   Not on file   Social Determinants of Health   Financial Resource Strain: Not on file  Food Insecurity: Not on file  Transportation Needs: Not on file  Physical Activity: Not on file   Stress: Not on file  Social Connections: Not on file  Intimate Partner Violence: Not on file     Review of Systems: General: negative for chills, fever, night sweats or weight changes.  Cardiovascular: negative for chest pain, dyspnea on exertion, edema, orthopnea, palpitations, paroxysmal nocturnal dyspnea or shortness of breath Dermatological: negative for rash Respiratory: negative for cough or wheezing Urologic: negative for hematuria Abdominal: negative for nausea, vomiting, diarrhea, bright red blood per rectum, melena, or hematemesis Neurologic: negative for visual changes, syncope, or dizziness All other systems reviewed and are otherwise negative except as noted above.    Blood pressure 134/70, pulse 66, height 6' (1.829 m), weight 266 lb (120.7 kg).  General appearance: alert and no distress Neck: no adenopathy, no carotid bruit, no JVD, supple, symmetrical, trachea midline, and thyroid not enlarged, symmetric, no tenderness/mass/nodules Lungs: clear to auscultation bilaterally Heart: regular rate and rhythm, S1, S2 normal, no murmur, click, rub or gallop Extremities: extremities normal, atraumatic, no cyanosis or edema Pulses: 2+ and symmetric Skin: Skin color, texture, turgor normal. No rashes or lesions Neurologic: Grossly normal  EKG EKG Interpretation Date/Time:  Wednesday October 06 2023 09:55:47 EDT Ventricular Rate:  66 PR Interval:  154 QRS Duration:  94 QT Interval:  410 QTC Calculation: 429 R Axis:   37  Text Interpretation: Sinus rhythm with frequent Premature ventricular complexes in a pattern of bigeminy Possible Left atrial enlargement Nonspecific ST abnormality When compared with ECG of 27-Nov-2014 07:14, Premature ventricular complexes are now Present T wave inversion no longer evident in Anterior leads Confirmed by Nanetta Batty [phone]) on 10/06/2023 10:14:40 AM    ASSESSMENT AND PLAN:   History of NSTEMI- History of CAD status post non-STEMI  in 11/23/2014.  He underwent cardiac catheterization by myself 11/26/2014 radially revealing a 90% mid LAD lesion which I stented using drug-eluting stent.  He had moderate ramus branch disease and normal LV function.  He denies chest pain or shortness of breath.  He remains on dual antiplatelet therapy.  Essential hypertension History of essential hypertension blood pressure measured today 134/70.  He is on losartan and metoprolol.  Hyperlipidemia with target LDL less than 70 History of hyperlipidemia on statin therapy with lipid profile performed 01/07/2023 revealing a total cholesterol 112, LDL 59 HDL 41.  PVC's (premature ventricular contractions) History of palpitations with frequent PVCs.  Event monitor performed 1//22 revealing a PVC burden of 30%.  He is in bigeminy today.  He is on a beta-blocker.  He does not drink caffeine or alcohol.  He has seen Dr. Ladona [name] several years ago who recommended watchful waiting.  I am going to refer him back for consideration of antiarrhythmic therapy.  Nonischemic cardiomyopathy (HCC) Nonischemic cardiomyopathy with echo performed 02/08/2020 revealing mild global hypokinesia with an EF of  45 to 50% with his PVC burden of 30% I suspect this is PVC mediated.     [name] Gess MD FACP,FACC,FAHA, [facility] 10/06/2023 10:33 AM

## 2023-10-06 NOTE — Assessment & Plan Note (Signed)
History of CAD status post non-STEMI in 11/23/2014.  He underwent cardiac catheterization by myself 11/26/2014 radially revealing a 90% mid LAD lesion which I stented using drug-eluting stent.  He had moderate ramus branch disease and normal LV function.  He denies chest pain or shortness of breath.  He remains on dual antiplatelet therapy.

## 2023-10-06 NOTE — Assessment & Plan Note (Signed)
History of hyperlipidemia on statin therapy with lipid profile performed 01/07/2023 revealing a total cholesterol 112, LDL 59 HDL 41.

## 2023-10-06 NOTE — Assessment & Plan Note (Signed)
History of essential hypertension blood pressure measured today 134/70.  He is on losartan and metoprolol.

## 2023-10-06 NOTE — Assessment & Plan Note (Addendum)
History of palpitations with frequent PVCs.  Event monitor performed 1//22 revealing a PVC burden of 30%.  He is in bigeminy today.  He is on a beta-blocker.  He does not drink caffeine or alcohol.  He has seen Dr. Ladona Ridgel several years ago who recommended watchful waiting.  I am going to refer him back for consideration of antiarrhythmic therapy.

## 2023-10-06 NOTE — Assessment & Plan Note (Signed)
Nonischemic cardiomyopathy with echo performed 02/08/2020 revealing mild global hypokinesia with an EF of 45 to 50% with his PVC burden of 30% I suspect this is PVC mediated.

## 2023-10-06 NOTE — Patient Instructions (Signed)
Medication Instructions:  Your physician recommends that you continue on your current medications as directed. Please refer to the Current Medication list given to you today.  *If you need a refill on your cardiac medications before your next appointment, please call your pharmacy*    Follow-Up: At [facility], you and your health needs are our priority.  As part of our continuing mission to provide you with exceptional heart care, we have created designated Provider Care Teams.  These Care Teams include your primary Cardiologist (physician) and Advanced Practice Providers (APPs -  Physician Assistants and Nurse Practitioners) who all work together to provide you with the care you need, when you need it.  We recommend signing up for the patient portal called "MyChart".  Sign up information is provided on this After Visit Summary.  MyChart is used to connect with patients for Virtual Visits (Telemedicine).  Patients are able to view lab/test results, encounter notes, upcoming appointments, etc.  Non-urgent messages can be sent to your provider as well.   To learn more about what you can do with MyChart, go to [email or web address].    Your next appointment:   6 month(s)  Provider:   Marjie Skiff, PA-C, Robet Leu, PA-C, Juanda Crumble, PA-C, Azalee Course, PA-C, or Bernadene Person, NP        Then, Nanetta Batty, MD will plan to see you again in 12 month(s).

## 2023-11-05 ENCOUNTER — Ambulatory Visit (INDEPENDENT_AMBULATORY_CARE_PROVIDER_SITE_OTHER): Payer: PRIVATE HEALTH INSURANCE

## 2023-11-05 ENCOUNTER — Ambulatory Visit: Payer: PRIVATE HEALTH INSURANCE | Attending: Internal Medicine | Admitting: Internal Medicine

## 2023-11-05 ENCOUNTER — Encounter: Payer: Self-pay | Admitting: Internal Medicine

## 2023-11-05 VITALS — BP 152/60 | HR 66 | Ht 72.0 in | Wt 270.0 lb

## 2023-11-05 DIAGNOSIS — I493 Ventricular premature depolarization: Secondary | ICD-10-CM

## 2023-11-05 MED ORDER — METOPROLOL SUCCINATE ER 50 MG PO TB24: 50.0000 mg | ORAL_TABLET | Freq: Every day | ORAL | 3 refills | Status: DC

## 2023-11-05 NOTE — Patient Instructions (Addendum)
Medication Instructions:  Your physician has recommended you make the following change in your medication:    STOP metoprolol tartrate  START taking metoprolol succinate (Toprol XL) 50 mg-  Take one tablet by mouth daily  *If you need a refill on your cardiac medications before your next appointment, please call your pharmacy*  Lab Work: None ordered.  If you have labs (blood work) drawn today and your tests are completely normal, you will receive your results only by: MyChart Message (if you have MyChart) OR A paper copy in the mail If you have any lab test that is abnormal or we need to change your treatment, we will call you to review the results.  Testing/Procedures: You will wear a 3 day ZIO monitor in mid December.  ZIO XT- Long Term Monitor Instructions     Your physician has requested you wear a ZIO patch monitor for 3 days.  This is a single patch monitor. [employer] supplies one patch monitor per enrollment. Additional  stickers are not available. Please do not apply patch if you will be having a Nuclear Stress Test,  Echocardiogram, Cardiac CT, MRI, or Chest Xray during the period you would be wearing the  monitor. The patch cannot be worn during these tests. You cannot remove and re-apply the  ZIO XT patch monitor.  Your ZIO patch monitor will be mailed 3 day USPS to your address on file. It may take 3-5 days  to receive your monitor after you have been enrolled.  Once you have received your monitor, please review the enclosed instructions. Your monitor  has already been registered assigning a specific monitor serial # to you.     Billing and Patient Assistance Program Information     We have supplied [employer] with any of your insurance information on file for billing purposes.  [employer] offers a sliding scale Patient Assistance Program for patients that do not have  insurance, or whose insurance does not completely cover the cost of the ZIO monitor.  You must apply for  the Patient Assistance Program to qualify for this discounted rate.  To apply, please call [employer] at [phone], select option 4, select option 2, ask to apply for  Patient Assistance Program. Meredeth Ide will ask your household income, and how many people  are in your household. They will quote your out-of-pocket cost based on that information.  [employer] will also be able to set up a 59-month, interest-free payment plan if needed.     Applying the monitor     Shave hair from upper left chest.  Hold abrader disc by orange tab. Rub abrader in 40 strokes over the upper left chest as  indicated in your monitor instructions.  Clean area with 4 enclosed alcohol pads. Let dry.  Apply patch as indicated in monitor instructions. Patch will be placed under collarbone on left  side of chest with arrow pointing upward.  Rub patch adhesive wings for 2 minutes. Remove white label marked "1". Remove the white  label marked "2". Rub patch adhesive wings for 2 additional minutes.  While looking in a mirror, press and release button in center of patch. A small green light will  flash 3-4 times. This will be your only indicator that the monitor has been turned on.  Do not shower for the first 24 hours. You may shower after the first 24 hours.  Press the button if you feel a symptom. You will hear a small click. Record Date, Time and  Symptom  in the Patient Logbook.  When you are ready to remove the patch, follow instructions on the last 2 pages of Patient  Logbook. Stick patch monitor onto the last page of Patient Logbook.  Place Patient Logbook in the blue and white box. Use locking tab on box and tape box closed  securely. The blue and white box has prepaid postage on it. Please place it in the mailbox as  soon as possible. Your physician should have your test results approximately 7 days after the  monitor has been mailed back to [facility].  Call [facility] Customer Care at [phone] if you  have questions regarding  your ZIO XT patch monitor. Call them immediately if you see an orange light blinking on your  monitor.  If your monitor falls off in less than 4 days, contact our Monitor department at [phone].  If your monitor becomes loose or falls off after 4 days call [employer] at [phone] for  suggestions on securing your monitor.    Follow-Up: At [facility], you and your health needs are our priority.  As part of our continuing mission to provide you with exceptional heart care, we have created designated Provider Care Teams.  These Care Teams include your primary Cardiologist (physician) and Advanced Practice Providers (APPs -  Physician Assistants and Nurse Practitioners) who all work together to provide you with the care you need, when you need it.  We recommend signing up for the patient portal called "MyChart".  Sign up information is provided on this After Visit Summary.  MyChart is used to connect with patients for Virtual Visits (Telemedicine).  Patients are able to view lab/test results, encounter notes, upcoming appointments, etc.  Non-urgent messages can be sent to your provider as well.   To learn more about what you can do with MyChart, go to [email or web address].    Your next appointment:    Follow up based on results of heart monitor

## 2023-11-05 NOTE — Progress Notes (Signed)
HPI [name] [name] returns today for ongoing evaluation of PVC's. He has a h/o HTN, DM, and dyslipidemia who was found to have CAD after a NSTEMI several years ago. His EF is 45%. He has frequent PVC's despite beta blocker therapy and a cardiac monitor showed 16%. When I last saw the patient he was feeling better. He has been stable over the past 6 months. He is active in the yard though he rarely feels palpitations. He note several times a day, the sensation of breathlessness. He denies chest pain or lightheadedness. He has been unable to tolerate higher doses of beta blockers.  No Known Allergies   Current Outpatient Medications  Medication Sig Dispense Refill   aspirin 81 MG chewable tablet Chew 1 tablet (81 mg total) by mouth daily.     atorvastatin (LIPITOR) 80 MG tablet TAKE 1 TABLET BY MOUTH  DAILY AT 6PM 90 tablet 2   clopidogrel (PLAVIX) 75 MG tablet TAKE 1 TABLET BY MOUTH EVERY DAY 90 tablet 1   Insulin Glargine (LANTUS SOLOSTAR) 100 UNIT/ML Solostar Pen Inject 45 Units into the skin daily.     losartan (COZAAR) 100 MG tablet Take 100 mg by mouth daily.     metFORMIN (GLUCOPHAGE) 1000 MG tablet Take 1 tablet (1,000 mg total) by mouth 2 (two) times daily with a meal.     metoprolol succinate (TOPROL-XL) 50 MG 24 hr tablet Take 1 tablet (50 mg total) by mouth daily. Take with or immediately following a meal. 90 tablet 3   pioglitazone (ACTOS) 45 MG tablet Take 45 mg by mouth daily.     No current facility-administered medications for this visit.     Past Medical History:  Diagnosis Date   CAD (coronary artery disease), native coronary artery, residual RCA disease  11/27/2014   Diabetes mellitus without complication (HCC)    type 2   Family history of heart disease    Hyperlipidemia    Hypertension    S/P angioplasty with stent mLAD 11/26/14 11/27/2014    ROS:   All systems reviewed and negative except as noted in the HPI.   Past Surgical History:  Procedure  Laterality Date   CERVICAL FUSION     LEFT HEART CATHETERIZATION WITH CORONARY ANGIOGRAM N/A 11/26/2014   Procedure: LEFT HEART CATHETERIZATION WITH CORONARY ANGIOGRAM;  Surgeon: Runell Gess, MD;  Location: [facility] CATH LAB;  Service: Cardiovascular;  Laterality: N/A;     Family History  Problem Relation Age of Onset   Heart attack Father    Heart attack Brother      Social History   Socioeconomic History   Marital status: Married    Spouse name: Not on file   Number of children: Not on file   Years of education: Not on file   Highest education level: Not on file  Occupational History   Not on file  Tobacco Use   Smoking status: Former   Smokeless tobacco: Never  Substance and Sexual Activity   Alcohol use: Yes    Comment: occasional   Drug use: No   Sexual activity: Not on file  Other Topics Concern   Not on file  Social History Narrative   Not on file   Social Determinants of Health   Financial Resource Strain: Not on file  Food Insecurity: Not on file  Transportation Needs: Not on file  Physical Activity: Not on file  Stress: Not on file  Social Connections: Not on file  Intimate  Partner Violence: Not on file     BP (!) 152/60   Pulse 66   Ht 6' (1.829 m)   Wt 270 lb (122.5 kg)   SpO2 96%   BMI 36.62 kg/m   Physical Exam:  Well appearing middle aged man, NAD HEENT: Unremarkable Neck:  No JVD, no thyromegally Lymphatics:  No adenopathy Back:  No CVA tenderness Lungs:  Clear with no wheezes HEART:  Regular rate rhythm, no murmurs, no rubs, no clicks Abd:  soft, positive bowel sounds, no organomegally, no rebound, no guarding Ext:  2 plus pulses, no edema, no cyanosis, no clubbing Skin:  No rashes no nodules Neuro:  CN II through XII intact, motor grossly intact  DEVICE  Normal device function.  See PaceArt for details.   Assess/Plan: 1. PVC's - he has more today on his ECG though he is not symptomatic. His periods of brief breathlessness  could be NS VT though it could be many other things. I have asked him to switch to long acting toprol.  I considered adding another AA drug but will hold off pending the results of his heart monitor. As he is minimally symptomatic, I would not add an AA drug unleass he is over 15% PV's daily.  2. LV dysfunction - he remains active working in his yard with class 1 symptoms.  3. HTN - his sbp is minimally elevated. He notes that it is usually well controlled. 4. Obesity - I encouraged him to lose weight. He is at least 50 lbs over weight.   [name] Gowda Patti Shorb,MD

## 2023-11-05 NOTE — Progress Notes (Unsigned)
Enrolled patient for a 3 day Zio XT monitor to be mailed to patients home. Patient  to wear mid Dec.

## 2023-11-19 DIAGNOSIS — I493 Ventricular premature depolarization: Secondary | ICD-10-CM | POA: Diagnosis not present

## 2023-12-22 ENCOUNTER — Encounter: Payer: Self-pay | Admitting: Cardiovascular Disease

## 2024-01-12 ENCOUNTER — Encounter (INDEPENDENT_AMBULATORY_CARE_PROVIDER_SITE_OTHER): Payer: 59 | Admitting: Ophthalmology

## 2024-01-12 DIAGNOSIS — Z7984 Long term (current) use of oral hypoglycemic drugs: Secondary | ICD-10-CM

## 2024-01-12 DIAGNOSIS — H2513 Age-related nuclear cataract, bilateral: Secondary | ICD-10-CM

## 2024-01-12 DIAGNOSIS — H35033 Hypertensive retinopathy, bilateral: Secondary | ICD-10-CM

## 2024-01-12 DIAGNOSIS — E113392 Type 2 diabetes mellitus with moderate nonproliferative diabetic retinopathy without macular edema, left eye: Secondary | ICD-10-CM | POA: Diagnosis not present

## 2024-01-12 DIAGNOSIS — Z794 Long term (current) use of insulin: Secondary | ICD-10-CM | POA: Diagnosis not present

## 2024-01-12 DIAGNOSIS — I10 Essential (primary) hypertension: Secondary | ICD-10-CM

## 2024-01-12 DIAGNOSIS — H353112 Nonexudative age-related macular degeneration, right eye, intermediate dry stage: Secondary | ICD-10-CM

## 2024-01-12 DIAGNOSIS — H43813 Vitreous degeneration, bilateral: Secondary | ICD-10-CM

## 2024-01-21 ENCOUNTER — Encounter (INDEPENDENT_AMBULATORY_CARE_PROVIDER_SITE_OTHER): Payer: PRIVATE HEALTH INSURANCE | Admitting: Ophthalmology

## 2024-01-21 DIAGNOSIS — E113511 Type 2 diabetes mellitus with proliferative diabetic retinopathy with macular edema, right eye: Secondary | ICD-10-CM

## 2024-01-21 DIAGNOSIS — Z7984 Long term (current) use of oral hypoglycemic drugs: Secondary | ICD-10-CM | POA: Diagnosis not present

## 2024-01-21 DIAGNOSIS — Z794 Long term (current) use of insulin: Secondary | ICD-10-CM | POA: Diagnosis not present

## 2024-01-21 DIAGNOSIS — H353112 Nonexudative age-related macular degeneration, right eye, intermediate dry stage: Secondary | ICD-10-CM | POA: Diagnosis not present

## 2024-02-11 ENCOUNTER — Encounter (INDEPENDENT_AMBULATORY_CARE_PROVIDER_SITE_OTHER): Payer: 59 | Admitting: Ophthalmology

## 2024-02-11 DIAGNOSIS — E113592 Type 2 diabetes mellitus with proliferative diabetic retinopathy without macular edema, left eye: Secondary | ICD-10-CM | POA: Diagnosis not present

## 2024-02-11 DIAGNOSIS — Z794 Long term (current) use of insulin: Secondary | ICD-10-CM

## 2024-02-11 DIAGNOSIS — Z7984 Long term (current) use of oral hypoglycemic drugs: Secondary | ICD-10-CM

## 2024-02-21 ENCOUNTER — Encounter (INDEPENDENT_AMBULATORY_CARE_PROVIDER_SITE_OTHER): Payer: 59 | Admitting: Ophthalmology

## 2024-02-21 DIAGNOSIS — Z7984 Long term (current) use of oral hypoglycemic drugs: Secondary | ICD-10-CM

## 2024-02-21 DIAGNOSIS — Z794 Long term (current) use of insulin: Secondary | ICD-10-CM | POA: Diagnosis not present

## 2024-02-21 DIAGNOSIS — E113592 Type 2 diabetes mellitus with proliferative diabetic retinopathy without macular edema, left eye: Secondary | ICD-10-CM | POA: Diagnosis not present

## 2024-02-21 DIAGNOSIS — H35033 Hypertensive retinopathy, bilateral: Secondary | ICD-10-CM

## 2024-02-21 DIAGNOSIS — H43813 Vitreous degeneration, bilateral: Secondary | ICD-10-CM

## 2024-02-21 DIAGNOSIS — I10 Essential (primary) hypertension: Secondary | ICD-10-CM

## 2024-02-21 DIAGNOSIS — E113511 Type 2 diabetes mellitus with proliferative diabetic retinopathy with macular edema, right eye: Secondary | ICD-10-CM

## 2024-04-07 ENCOUNTER — Encounter (INDEPENDENT_AMBULATORY_CARE_PROVIDER_SITE_OTHER): Admitting: Ophthalmology

## 2024-04-12 ENCOUNTER — Encounter (INDEPENDENT_AMBULATORY_CARE_PROVIDER_SITE_OTHER): Admitting: Ophthalmology

## 2024-05-03 ENCOUNTER — Encounter (INDEPENDENT_AMBULATORY_CARE_PROVIDER_SITE_OTHER): Admitting: Ophthalmology

## 2024-05-03 DIAGNOSIS — Z794 Long term (current) use of insulin: Secondary | ICD-10-CM

## 2024-05-03 DIAGNOSIS — E113511 Type 2 diabetes mellitus with proliferative diabetic retinopathy with macular edema, right eye: Secondary | ICD-10-CM | POA: Diagnosis not present

## 2024-05-03 DIAGNOSIS — I10 Essential (primary) hypertension: Secondary | ICD-10-CM

## 2024-05-03 DIAGNOSIS — Z7984 Long term (current) use of oral hypoglycemic drugs: Secondary | ICD-10-CM | POA: Diagnosis not present

## 2024-05-03 DIAGNOSIS — E113592 Type 2 diabetes mellitus with proliferative diabetic retinopathy without macular edema, left eye: Secondary | ICD-10-CM

## 2024-05-03 DIAGNOSIS — H43813 Vitreous degeneration, bilateral: Secondary | ICD-10-CM

## 2024-05-03 DIAGNOSIS — H35033 Hypertensive retinopathy, bilateral: Secondary | ICD-10-CM

## 2024-05-10 ENCOUNTER — Encounter (INDEPENDENT_AMBULATORY_CARE_PROVIDER_SITE_OTHER): Admitting: Ophthalmology

## 2024-05-15 ENCOUNTER — Other Ambulatory Visit: Payer: Self-pay | Admitting: Cardiovascular Disease

## 2024-05-15 ENCOUNTER — Encounter (INDEPENDENT_AMBULATORY_CARE_PROVIDER_SITE_OTHER): Admitting: Ophthalmology

## 2024-05-15 DIAGNOSIS — E113511 Type 2 diabetes mellitus with proliferative diabetic retinopathy with macular edema, right eye: Secondary | ICD-10-CM

## 2024-05-15 DIAGNOSIS — Z7984 Long term (current) use of oral hypoglycemic drugs: Secondary | ICD-10-CM | POA: Diagnosis not present

## 2024-05-15 DIAGNOSIS — Z794 Long term (current) use of insulin: Secondary | ICD-10-CM | POA: Diagnosis not present

## 2024-06-05 ENCOUNTER — Encounter (INDEPENDENT_AMBULATORY_CARE_PROVIDER_SITE_OTHER): Admitting: Ophthalmology

## 2024-06-05 DIAGNOSIS — Z794 Long term (current) use of insulin: Secondary | ICD-10-CM

## 2024-06-05 DIAGNOSIS — H35052 Retinal neovascularization, unspecified, left eye: Secondary | ICD-10-CM

## 2024-06-05 DIAGNOSIS — H2513 Age-related nuclear cataract, bilateral: Secondary | ICD-10-CM

## 2024-06-05 DIAGNOSIS — Z7985 Long-term (current) use of injectable non-insulin antidiabetic drugs: Secondary | ICD-10-CM

## 2024-06-05 DIAGNOSIS — H35033 Hypertensive retinopathy, bilateral: Secondary | ICD-10-CM

## 2024-06-05 DIAGNOSIS — E113511 Type 2 diabetes mellitus with proliferative diabetic retinopathy with macular edema, right eye: Secondary | ICD-10-CM | POA: Diagnosis not present

## 2024-06-05 DIAGNOSIS — E113592 Type 2 diabetes mellitus with proliferative diabetic retinopathy without macular edema, left eye: Secondary | ICD-10-CM | POA: Diagnosis not present

## 2024-06-05 DIAGNOSIS — I10 Essential (primary) hypertension: Secondary | ICD-10-CM

## 2024-07-03 ENCOUNTER — Encounter (INDEPENDENT_AMBULATORY_CARE_PROVIDER_SITE_OTHER): Admitting: Ophthalmology

## 2024-07-03 DIAGNOSIS — H35052 Retinal neovascularization, unspecified, left eye: Secondary | ICD-10-CM

## 2024-07-18 ENCOUNTER — Encounter (INDEPENDENT_AMBULATORY_CARE_PROVIDER_SITE_OTHER): Admitting: Ophthalmology

## 2024-08-16 ENCOUNTER — Other Ambulatory Visit: Payer: Self-pay | Admitting: Internal Medicine

## 2024-09-14 ENCOUNTER — Other Ambulatory Visit: Payer: Self-pay | Admitting: Internal Medicine

## 2024-09-26 NOTE — Progress Notes (Unsigned)
 Cardiology Office Note:    Date:  09/28/2024   ID:  [name] [name], DOB 12-24-1960, MRN [id number]  PCP:  Nanci Senior, MD   [facility] Providers Cardiologist:  Dorn Lesches, MD Cardiology APP:  Madie Jon Garre, PA     Referring MD: Nanci Senior, MD   Chief Complaint  Patient presents with   Follow-up    PVC    History of Present Illness:    [name] [name] is a 63 y.o. male with a hx of PVCs, hypertension, DM, hyperlipidemia, CAD, chronic systolic heart failure. NSTEMI in 2015 treated with DES-mLAD (90% stenosis). Nuclear stress test showed apical thinning with LVEF 45-50% felt related to PVCs. Heart monitor 2022 showed frequent PVCs with bigeminy/trigeminy with a 30% burden. He was started on 50 mg toprol . Repeat heart monitor 2024 showed a reduction in PVCs to 15% burden. AA medication was deferred. Last seen by Dr. Waddell in 2024.   He presents for cardiology follow up. He has no issues with hypervolemia. He does report pin point right sided chest pain that lasts for seconds, does not wake him from sleep, no pattern.   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 We discussed increasing activity - may join [employer] in [place] where he works.   Not bothered by palpitations.   Past Medical History:  Diagnosis Date   CAD (coronary artery disease), native coronary artery, residual RCA disease  11/27/2014   Diabetes mellitus without complication (HCC)    type 2   Family history of heart disease    Hyperlipidemia    Hypertension    S/P angioplasty with stent mLAD 11/26/14 11/27/2014    Past Surgical History:  Procedure Laterality Date   CERVICAL FUSION     LEFT HEART CATHETERIZATION WITH CORONARY ANGIOGRAM N/A 11/26/2014   Procedure: LEFT HEART CATHETERIZATION WITH CORONARY ANGIOGRAM;  Surgeon: Dorn JINNY Lesches, MD;  Location: [facility] CATH LAB;  Service: Cardiovascular;  Laterality: N/A;     Current Medications: Current Meds  Medication Sig   aspirin  81 MG chewable tablet Chew 1 tablet (81 mg total) by mouth daily.   atorvastatin  (LIPITOR ) 80 MG tablet TAKE 1 TABLET BY MOUTH  DAILY AT 6PM   clopidogrel  (PLAVIX ) 75 MG tablet TAKE 1 TABLET BY MOUTH EVERY DAY   Insulin  Glargine (LANTUS  SOLOSTAR) 100 UNIT/ML Solostar Pen Inject 45 Units into the skin daily.   losartan (COZAAR) 100 MG tablet Take 100 mg by mouth daily.   metFORMIN  (GLUCOPHAGE ) 1000 MG tablet Take 1 tablet (1,000 mg total) by mouth 2 (two) times daily with a meal.   metoprolol  succinate (TOPROL -XL) 50 MG 24 hr tablet TAKE 1 TABLET BY MOUTH EVERY DAY WITH OR IMMEDIATELY FOLLOWING A MEAL   pioglitazone  (ACTOS ) 45 MG tablet Take 45 mg by mouth daily.     Allergies:   Patient has no known allergies.   Social History   Socioeconomic History   Marital status: Married    Spouse name: Not on file   Number of children: Not on file   Years of education: Not on file   Highest education level: Not on file  Occupational History   Not on file  Tobacco Use   Smoking status: Former   Smokeless tobacco: Never  Substance and Sexual Activity   Alcohol use: Yes    Comment: occasional   Drug use: No   Sexual activity: Not on file  Other Topics Concern  Not on file  Social History Narrative   Not on file   Social Drivers of Health   Financial Resource Strain: Low Risk  (04/18/2024)   Received [facility]   Overall Financial Resource Strain (CARDIA)    Difficulty of Paying Living Expenses: Not hard at all  Food Insecurity: No Food Insecurity (04/18/2024)   Received from [facility]   Hunger Vital Sign    Within the past 12 months, you worried that your food would run out before you got the money to buy more.: Never true    Within the past 12 months, the food you bought just didn't last and you didn't have money to get more.: Never true  Transportation Needs: No Transportation Needs (04/18/2024)   Received  from [facility] - Transportation    Lack of Transportation (Medical): No    Lack of Transportation (Non-Medical): No  Physical Activity: Not on file  Stress: Not on file  Social Connections: Not on file     Family History: The patient's family history includes Heart attack in his brother and father.  ROS:   Please see the history of present illness.     All other systems reviewed and are negative.  EKGs/Labs/Other Studies Reviewed:    The following studies were reviewed today:  EKG Interpretation Date/Time:  Thursday September 28 2024 09:46:38 EDT Ventricular Rate:  74 PR Interval:  158 QRS Duration:  92 QT Interval:  386 QTC Calculation: 428 R Axis:   53  Text Interpretation: Normal sinus rhythm Normal ECG When compared with ECG of 06-Oct-2023 09:55, Premature ventricular complexes are no longer Present Confirmed by Madie Slough ([id number]) on 09/28/2024 9:51:25 AM    Recent Labs: No results found for requested labs within last 365 days.  Recent Lipid Panel    Component Value Date/Time   CHOL 90 01/24/2015 0827   TRIG 47 01/24/2015 0827   HDL 34 (L) 01/24/2015 0827   CHOLHDL 2.6 01/24/2015 0827   VLDL 9 01/24/2015 0827   LDLCALC 47 01/24/2015 0827     Risk Assessment/Calculations:                Physical Exam:    VS:  BP 124/76   Pulse 74   Ht 6' (1.829 m)   Wt 242 lb (109.8 kg)   SpO2 99%   BMI 32.82 kg/m     Wt Readings from Last 3 Encounters:  09/28/24 242 lb (109.8 kg)  11/05/23 270 lb (122.5 kg)  10/06/23 266 lb (120.7 kg)     GEN:  Well nourished, well developed in no acute distress HEENT: Normal NECK: No JVD; No carotid bruits LYMPHATICS: No lymphadenopathy CARDIAC: RRR, no murmurs, rubs, gallops RESPIRATORY:  Clear to auscultation without rales, wheezing or rhonchi  ABDOMEN: Soft, non-tender, non-distended MUSCULOSKELETAL:  No edema; No deformity  SKIN: Warm and dry NEUROLOGIC:  Alert and oriented x 3 PSYCHIATRIC:  Normal  affect   ASSESSMENT:    1. PVC's (premature ventricular contractions)   2. Palpitations   3. CAD S/P percutaneous coronary angioplasty   4. Chronic systolic heart failure (HCC)   5. Primary hypertension   6. Hyperlipidemia with target LDL less than 70    PLAN:    In order of problems listed above:   PVCs - Continue 50 mg Toprol  - reduction in PVCs with BB -- per EP, no AA -- no further palpitations   CAD History of NSTEMI, DES-midLAD in 11/2014 - Remains  on aspirin  and Plavix  -- nuclear stress test 2021  -- suggested to stop ASA, continue plavix  monotherapy -- do not think sharp chest pain that lasts for a few seconds is angina -- recommended to start an exercise program   Chronic systolic heart failure Hypertension - LVEF 45-50%, felt related to high PVC burden - continue 100 mg losartan, 50 mg toprol  - does not require lasix - euvolemic today - given lack of symptoms, will defer repeat echo   Hyperlipidemia with LDL goal < 70 - lipid panel Jan 2025 with LDL 44, trig 56 - continue 80 mg lipitor    Follow up with Dr. Court in 1 year.      Medication Adjustments/Labs and Tests Ordered: Current medicines are reviewed at length with the patient today.  Concerns regarding medicines are outlined above.  Orders Placed This Encounter  Procedures   EKG 12-Lead   No orders of the defined types were placed in this encounter.   Patient Instructions  Medication Instructions:  Stop Plavix  *If you need a refill on your cardiac medications before your next appointment, please call your pharmacy*   Your next appointment:   1 year(s)  Provider:   Dorn Court, MD               Signed, Jon Garre Tatanisha Cuthbert, PA  09/28/2024 10:08 AM    [facility]

## 2024-09-28 ENCOUNTER — Encounter: Payer: Self-pay | Admitting: Physician Assistant

## 2024-09-28 ENCOUNTER — Ambulatory Visit: Payer: PRIVATE HEALTH INSURANCE | Attending: Cardiology | Admitting: Physician Assistant

## 2024-09-28 VITALS — BP 124/76 | HR 74 | Ht 72.0 in | Wt 242.0 lb

## 2024-09-28 DIAGNOSIS — I5022 Chronic systolic (congestive) heart failure: Secondary | ICD-10-CM | POA: Diagnosis not present

## 2024-09-28 DIAGNOSIS — Z9861 Coronary angioplasty status: Secondary | ICD-10-CM

## 2024-09-28 DIAGNOSIS — R002 Palpitations: Secondary | ICD-10-CM

## 2024-09-28 DIAGNOSIS — E785 Hyperlipidemia, unspecified: Secondary | ICD-10-CM

## 2024-09-28 DIAGNOSIS — I2510 Atherosclerotic heart disease of native coronary artery without angina pectoris: Secondary | ICD-10-CM | POA: Diagnosis not present

## 2024-09-28 DIAGNOSIS — I10 Essential (primary) hypertension: Secondary | ICD-10-CM

## 2024-09-28 DIAGNOSIS — I493 Ventricular premature depolarization: Secondary | ICD-10-CM | POA: Diagnosis not present

## 2024-09-28 MED ORDER — CLOPIDOGREL BISULFATE 75 MG PO TABS: 75.0000 mg | ORAL_TABLET | Freq: Every day | ORAL | 3 refills | Status: AC

## 2024-09-28 MED ORDER — ATORVASTATIN CALCIUM 80 MG PO TABS: 80.0000 mg | ORAL_TABLET | Freq: Every day | ORAL | 3 refills | Status: AC

## 2024-09-28 MED ORDER — METOPROLOL SUCCINATE ER 50 MG PO TB24: 50.0000 mg | ORAL_TABLET | Freq: Every day | ORAL | 3 refills | Status: AC

## 2024-09-28 NOTE — Patient Instructions (Addendum)
 Medication Instructions:  Stop Aspirin  *If you need a refill on your cardiac medications before your next appointment, please call your pharmacy*   Your next appointment:   1 year(s)  Provider:   Dorn Lesches, MD

## 2024-11-02 ENCOUNTER — Encounter (INDEPENDENT_AMBULATORY_CARE_PROVIDER_SITE_OTHER): Admitting: Ophthalmology

## 2024-11-02 DIAGNOSIS — I10 Essential (primary) hypertension: Secondary | ICD-10-CM

## 2024-11-02 DIAGNOSIS — Z7984 Long term (current) use of oral hypoglycemic drugs: Secondary | ICD-10-CM

## 2024-11-02 DIAGNOSIS — H35033 Hypertensive retinopathy, bilateral: Secondary | ICD-10-CM

## 2024-11-02 DIAGNOSIS — E113593 Type 2 diabetes mellitus with proliferative diabetic retinopathy without macular edema, bilateral: Secondary | ICD-10-CM | POA: Diagnosis not present

## 2024-11-02 DIAGNOSIS — Z794 Long term (current) use of insulin: Secondary | ICD-10-CM

## 2024-11-02 DIAGNOSIS — H43813 Vitreous degeneration, bilateral: Secondary | ICD-10-CM

## 2025-02-08 ENCOUNTER — Encounter (INDEPENDENT_AMBULATORY_CARE_PROVIDER_SITE_OTHER): Admitting: Ophthalmology
